# Patient Record
Sex: Female | Born: 1990 | Race: White | Hispanic: Refuse to answer | Marital: Married | Smoking: Never smoker
Health system: Southern US, Community
[De-identification: ages and names within clinical notes are randomized; demographics above are authoritative.]

## PROBLEM LIST (undated history)

## (undated) ENCOUNTER — Inpatient Hospital Stay (HOSPITAL_COMMUNITY): Payer: Self-pay

## (undated) DIAGNOSIS — Z789 Other specified health status: Secondary | ICD-10-CM

## (undated) DIAGNOSIS — O1493 Unspecified pre-eclampsia, third trimester: Secondary | ICD-10-CM

## (undated) HISTORY — PX: WISDOM TOOTH EXTRACTION: SHX21

## (undated) HISTORY — DX: Unspecified pre-eclampsia, third trimester: O14.93

---
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
## 2010-12-03 NOTE — Procedures (Signed)
VASCULAR LAB EXAM   INDICATION:  Bilateral leg lumps on the lateral aspect of the legs.   HISTORY:  Diabetes:  No.  Cardiac:  No.  Hypertension:  No.   EXAM:  Duplex of bilateral lower extremity below calf.   IMPRESSION:  Nonvascularized mass noted at the lateral aspect of  bilateral legs.  The right measured 0.41 cm x 0.71 cm and the second one  on the right measured 0.69 cm x 0.64 cm.  The left measured 0.69 x 0.64  cm.  The second one on the left measured 0.59 cm x 0.95 cm.   ___________________________________________  Larina Earthly, M.D.   MG/MEDQ  D:  11/05/2007  T:  11/05/2007  Job:  [id number]

## 2010-12-03 NOTE — Consult Note (Signed)
NEW PATIENT CONSULTATION   [name], [name]  DOB:  18-Oct-1990                                       11/05/2007  CHART#:[id number]   The patient presents today for evaluation of masses on her lower  extremities.  She is an otherwise healthy 20 year old female who is here  accompanied by her mother.  She had noticed these protrusions over her  bilateral lateral calves and pretibial areas.  She does not have any  pain associated with these.  There are no skin changes above them.  I am  seeing her today to determine if these are related to venous  varicosities.  She does not have any history of deep venous thrombosis.  Otherwise, she is a very healthy young lady who is a Holiday representative at [employer].  She runs on the cross-country team.  She is planning on  attending college in the fall.   PHYSICAL EXAM:  Well-developed well-nourished white female appears  stated age of 9.  Blood pressure 139/80, pulse 68, respirations 14.  Her lower extremities did not have any evidence of telangiectasia or  saphenous varicosities.  She does have several small masses over the  pretibial area bilaterally.  These are more pronounced on the left than  the right.   I imaged her saphenous veins and these appear to be normal.  On duplex  imaging of these masses, these are not vascular.  Specifically not  varicose veins.  They appear to be the same tissue density as fat.  I  discussed this with the patient and her mother present.  I explained  that this is not related to venous varicosities.  She does have 2+  dorsalis pedis as expected.  Explained I do not see any vascular  pathology.  I suspect that these may be a small lipoma.  I explained  that I am not concerned with any malignancy, since they are bilateral.  Her mother is still concerned regarding the presence of these.  I  explained that further evaluation, if she wishes to proceed, could  possibly be done with an MRI,  although I am not sure this is appropriate  and further consultation may be obtained from either general surgery or  orthopedic surgery.  They will see [place] again on an as-needed basis.   Larina Earthly, M.D.  Electronically Signed   TFE/MEDQ  D:  11/05/2007  T:  11/08/2007  Job:  [id number]   cc:   Evelena Peat, M.D.

## 2016-12-11 LAB — HM PAP SMEAR: HM Pap smear: NEGATIVE

## 2017-12-18 LAB — OB RESULTS CONSOLE ANTIBODY SCREEN: ANTIBODY SCREEN: NEGATIVE

## 2017-12-18 LAB — OB RESULTS CONSOLE RUBELLA ANTIBODY, IGM: Rubella: IMMUNE

## 2017-12-18 LAB — OB RESULTS CONSOLE GC/CHLAMYDIA
Chlamydia: NEGATIVE
Gonorrhea: NEGATIVE

## 2017-12-18 LAB — OB RESULTS CONSOLE RPR: RPR: NONREACTIVE

## 2017-12-18 LAB — OB RESULTS CONSOLE HIV ANTIBODY (ROUTINE TESTING): HIV: NONREACTIVE

## 2017-12-18 LAB — OB RESULTS CONSOLE HEPATITIS B SURFACE ANTIGEN: HEP B S AG: NEGATIVE

## 2017-12-22 LAB — OB RESULTS CONSOLE ABO/RH: RH Type: POSITIVE

## 2018-06-30 ENCOUNTER — Encounter (HOSPITAL_COMMUNITY): Payer: Self-pay | Admitting: *Deleted

## 2018-06-30 ENCOUNTER — Encounter (HOSPITAL_COMMUNITY): Payer: Self-pay | Admitting: Emergency Medicine

## 2018-06-30 ENCOUNTER — Other Ambulatory Visit: Payer: Self-pay

## 2018-06-30 ENCOUNTER — Telehealth (HOSPITAL_COMMUNITY): Payer: Self-pay | Admitting: *Deleted

## 2018-06-30 ENCOUNTER — Emergency Department (HOSPITAL_COMMUNITY)
Admission: EM | Admit: 2018-06-30 | Discharge: 2018-07-01 | Disposition: A | Discharge status: Home / Self Care | Payer: BLUE CROSS/BLUE SHIELD | Attending: Emergency Medicine | Admitting: Emergency Medicine

## 2018-06-30 DIAGNOSIS — S00501A Unspecified superficial injury of lip, initial encounter: Secondary | ICD-10-CM | POA: Insufficient documentation

## 2018-06-30 DIAGNOSIS — Y999 Unspecified external cause status: Secondary | ICD-10-CM | POA: Insufficient documentation

## 2018-06-30 DIAGNOSIS — Y929 Unspecified place or not applicable: Secondary | ICD-10-CM | POA: Diagnosis not present

## 2018-06-30 DIAGNOSIS — Y939 Activity, unspecified: Secondary | ICD-10-CM | POA: Insufficient documentation

## 2018-06-30 DIAGNOSIS — W5501XA Bitten by cat, initial encounter: Secondary | ICD-10-CM | POA: Insufficient documentation

## 2018-06-30 DIAGNOSIS — Z5321 Procedure and treatment not carried out due to patient leaving prior to being seen by health care provider: Secondary | ICD-10-CM | POA: Diagnosis not present

## 2018-06-30 NOTE — Telephone Encounter (Signed)
Preadmission screen  

## 2018-06-30 NOTE — ED Triage Notes (Addendum)
Patient states her cat bit her lower lip around 10 pm tonight. Patient states it was her cat and the cat has all vaccinations up to date.

## 2018-07-08 ENCOUNTER — Inpatient Hospital Stay (HOSPITAL_COMMUNITY)
Admission: AD | Admit: 2018-07-08 | Discharge: 2018-07-08 | Disposition: A | Discharge status: Home / Self Care | Payer: BLUE CROSS/BLUE SHIELD | Source: Ambulatory Visit | Attending: Obstetrics and Gynecology | Admitting: Obstetrics and Gynecology

## 2018-07-08 ENCOUNTER — Encounter (HOSPITAL_COMMUNITY): Payer: Self-pay | Admitting: *Deleted

## 2018-07-08 DIAGNOSIS — O163 Unspecified maternal hypertension, third trimester: Secondary | ICD-10-CM | POA: Diagnosis not present

## 2018-07-08 DIAGNOSIS — O133 Gestational [pregnancy-induced] hypertension without significant proteinuria, third trimester: Secondary | ICD-10-CM

## 2018-07-08 DIAGNOSIS — I10 Essential (primary) hypertension: Secondary | ICD-10-CM | POA: Diagnosis present

## 2018-07-08 DIAGNOSIS — Z3A36 36 weeks gestation of pregnancy: Secondary | ICD-10-CM | POA: Diagnosis not present

## 2018-07-08 HISTORY — DX: Other specified health status: Z78.9

## 2018-07-08 LAB — PROTEIN / CREATININE RATIO, URINE
Creatinine, Urine: 37 mg/dL
PROTEIN CREATININE RATIO: 0.27 mg/mg{creat} — AB (ref 0.00–0.15)
Total Protein, Urine: 10 mg/dL

## 2018-07-08 LAB — COMPREHENSIVE METABOLIC PANEL
ALK PHOS: 80 U/L (ref 38–126)
ALT: 27 U/L (ref 0–44)
ANION GAP: 7 (ref 5–15)
AST: 24 U/L (ref 15–41)
Albumin: 3 g/dL — ABNORMAL LOW (ref 3.5–5.0)
BUN: 13 mg/dL (ref 6–20)
CO2: 21 mmol/L — ABNORMAL LOW (ref 22–32)
Calcium: 8.8 mg/dL — ABNORMAL LOW (ref 8.9–10.3)
Chloride: 106 mmol/L (ref 98–111)
Creatinine, Ser: 0.54 mg/dL (ref 0.44–1.00)
GFR calc Af Amer: >60 mL/min (ref >60)
GFR calc non Af Amer: >60 mL/min (ref >60)
Glucose, Bld: 72 mg/dL (ref 70–99)
Potassium: 3.8 mmol/L (ref 3.5–5.1)
Sodium: 134 mmol/L — ABNORMAL LOW (ref 135–145)
Total Bilirubin: 0.4 mg/dL (ref 0.3–1.2)
Total Protein: 6.1 g/dL — ABNORMAL LOW (ref 6.5–8.1)

## 2018-07-08 LAB — CBC
HCT: 41.7 % (ref 36.0–46.0)
Hemoglobin: 14.4 g/dL (ref 12.0–15.0)
MCH: 32.7 pg (ref 26.0–34.0)
MCHC: 34.5 g/dL (ref 30.0–36.0)
MCV: 94.8 fL (ref 80.0–100.0)
Platelets: 273 10*3/uL (ref 150–400)
RBC: 4.4 MIL/uL (ref 3.87–5.11)
RDW: 12.7 % (ref 11.5–15.5)
WBC: 10.4 10*3/uL (ref 4.0–10.5)
nRBC: 0 % (ref 0.0–0.2)

## 2018-07-08 LAB — URINALYSIS, ROUTINE W REFLEX MICROSCOPIC
Bilirubin Urine: NEGATIVE
Glucose, UA: NEGATIVE mg/dL
Hgb urine dipstick: NEGATIVE
KETONES UR: NEGATIVE mg/dL
Leukocytes, UA: NEGATIVE
Nitrite: NEGATIVE
PROTEIN: NEGATIVE mg/dL
Specific Gravity, Urine: 1.009 (ref 1.005–1.030)
pH: 7 (ref 5.0–8.0)

## 2018-07-08 NOTE — Discharge Instructions (Signed)
Hypertension During Pregnancy ° °Hypertension, commonly called high blood pressure, is when the force of blood pumping through your arteries is too strong. Arteries are blood vessels that carry blood from the heart throughout the body. Hypertension during pregnancy can cause problems for you and your baby. Your baby may be born early (prematurely) or may not weigh as much as he or she should at birth. Very bad cases of hypertension during pregnancy can be life-threatening. °Different types of hypertension can occur during pregnancy. These include: °· Chronic hypertension. This happens when: °? You have hypertension before pregnancy and it continues during pregnancy. °? You develop hypertension before you are [redacted] weeks pregnant, and it continues during pregnancy. °· Gestational hypertension. This is hypertension that develops after the 20th week of pregnancy. °· Preeclampsia, also called toxemia of pregnancy. This is a very serious type of hypertension that develops during pregnancy. It can be very dangerous for you and your baby. °? In rare cases, you may develop preeclampsia after giving birth (postpartum preeclampsia). This usually occurs within 48 hours after childbirth but may occur up to 6 weeks after giving birth. °Gestational hypertension and preeclampsia usually go away within 6 weeks after your baby is born. Women who have hypertension during pregnancy have a greater chance of developing hypertension later in life or during future pregnancies. °What are the causes? °The exact cause of hypertension during pregnancy is not known. °What increases the risk? °There are certain factors that make it more likely for you to develop hypertension during pregnancy. These include: °· Having hypertension during a previous pregnancy or prior to pregnancy. °· Being overweight. °· Being age 35 or older. °· Being pregnant for the first time. °· Being pregnant with more than one baby. °· Becoming pregnant using fertilization  methods such as IVF (in vitro fertilization). °· Having diabetes, kidney problems, or systemic lupus erythematosus. °· Having a family history of hypertension. °What are the signs or symptoms? °Chronic hypertension and gestational hypertension rarely cause symptoms. Preeclampsia causes symptoms, which may include: °· Increased protein in your urine. Your health care provider will check for this at every visit before you give birth (prenatal visit). °· Severe headaches. °· Sudden weight gain. °· Swelling of the hands, face, legs, and feet. °· Nausea and vomiting. °· Vision problems, such as blurred or double vision. °· Numbness in the face, arms, legs, and feet. °· Dizziness. °· Slurred speech. °· Sensitivity to bright lights. °· Abdominal pain. °· Convulsions or seizures. °How is this diagnosed? °You may be diagnosed with hypertension during a routine prenatal exam. At each prenatal visit, you may: °· Have a urine test to check for high amounts of protein in your urine. °· Have your blood pressure checked. A blood pressure reading is given as two numbers, such as "120 over 80" (or 120/80). The first ("top") number is a measure of the pressure in your arteries when your heart beats (systolic pressure). The second ("bottom") number is a measure of the pressure in your arteries as your heart relaxes between beats (diastolic pressure). Blood pressure is measured in a unit called mm Hg. For most women, a normal blood pressure reading is: °? Systolic: below 120. °? Diastolic: below 80. °The type of hypertension that you are diagnosed with depends on your test results and when your symptoms developed. °· Chronic hypertension is usually diagnosed before 20 weeks of pregnancy. °· Gestational hypertension is usually diagnosed after 20 weeks of pregnancy. °· Hypertension with high amounts of protein in   the urine is diagnosed as preeclampsia. °· Blood pressure measurements that stay above 160 systolic, or above 110 diastolic,  are signs of severe preeclampsia. °How is this treated? °Treatment for hypertension during pregnancy varies depending on the type of hypertension you have and how serious it is. °· If you take medicines called ACE inhibitors to treat chronic hypertension, you may need to switch medicines. ACE inhibitors should not be taken during pregnancy. °· If you have gestational hypertension, you may need to take blood pressure medicine. °· If you are at risk for preeclampsia, your health care provider may recommend that you take a low-dose aspirin during your pregnancy. °· If you have severe preeclampsia, you may need to be hospitalized so you and your baby can be monitored closely. You may also need to take medicine (magnesium sulfate) to prevent seizures and to lower blood pressure. This medicine may be given as an injection or through an IV. °· In some cases, if your condition gets worse, you may need to deliver your baby early. °Follow these instructions at home: °Eating and drinking ° °· Drink enough fluid to keep your urine pale yellow. °· Avoid caffeine. °Lifestyle °· Do not use any products that contain nicotine or tobacco, such as cigarettes and e-cigarettes. If you need help quitting, ask your health care provider. °· Do not use alcohol or drugs. °· Avoid stress as much as possible. Rest and get plenty of sleep. °General instructions °· Take over-the-counter and prescription medicines only as told by your health care provider. °· While lying down, lie on your left side. This keeps pressure off your major blood vessels. °· While sitting or lying down, raise (elevate) your feet. Try putting some pillows under your lower legs. °· Exercise regularly. Ask your health care provider what kinds of exercise are best for you. °· Keep all prenatal and follow-up visits as told by your health care provider. This is important. °Contact a health care provider if: °· You have symptoms that your health care provider told you may  require more treatment or monitoring, such as: °? Nausea or vomiting. °? Headache. °Get help right away if you have: °· Severe abdominal pain that does not get better with treatment. °· A severe headache that does not get better. °· Vomiting that does not get better. °· Sudden, rapid weight gain. °· Sudden swelling in your hands, ankles, or face. °· Vaginal bleeding. °· Blood in your urine. °· Fewer movements from your baby than usual. °· Blurred or double vision. °· Muscle twitching or sudden muscle tightening (spasms). °· Shortness of breath. °· Blue fingernails or lips. °Summary °· Hypertension, commonly called high blood pressure, is when the force of blood pumping through your arteries is too strong. °· Hypertension during pregnancy can cause problems for you and your baby. °· Treatment for hypertension during pregnancy varies depending on the type of hypertension you have and how serious it is. °· Get help right away if you have symptoms that your health care provider told you to watch for. °This information is not intended to replace advice given to you by your health care provider. Make sure you discuss any questions you have with your health care provider. °Document Released: 03/25/2011 Document Revised: 06/23/2017 Document Reviewed: 12/21/2015 °Elsevier Interactive Patient Education © 2019 [employer] ° °

## 2018-07-08 NOTE — MAU Note (Signed)
Pt sent from MD office for elevated BP, has been up for 3 weeks.  Pt denies HA, visual changes, nausea or epigastric pain.  Does have swelling in feet & ankles.  Denies contractions, bleeding or LOF.  Reports good fetal movement.

## 2018-07-08 NOTE — MAU Provider Note (Signed)
History     CSN: [id number]  Arrival date and time: 07/08/18 1240   First Provider Initiated Contact with Patient 07/08/18 1321      Chief Complaint  Patient presents with  . Hypertension   HPI [name] [name] is a 27 y.o. G1P0 at [redacted]w[redacted]d who presents for evaluation of hypertension. She has been monitored for gestational hypertension for the last 3 weeks but today her BP was higher than before. She denies any headache, visual changes or epigastric pain. Denies pain. Denies vaginal bleeding or leaking of fluid. Reports normal fetal movement.   OB History    Gravida  1   Para      Term      Preterm      AB      Living        SAB      TAB      Ectopic      Multiple      Live Births              Past Medical History:  Diagnosis Date  . Medical history non-contributory     Past Surgical History:  Procedure Laterality Date  . WISDOM TOOTH EXTRACTION      Family History  Problem Relation Age of Onset  . Depression Sister   . Breast cancer Paternal Aunt   . Stroke Maternal Grandmother     Social History   Tobacco Use  . Smoking status: Never Smoker  . Smokeless tobacco: Never Used  Substance Use Topics  . Alcohol use: Never    Frequency: Never  . Drug use: Never    Allergies:  Allergies  Allergen Reactions  . Cefaclor Other (See Comments)    Childhood allergy  . Penicillins Other (See Comments)    Childhood allergy  . Sulfamethoxazole-Trimethoprim Other (See Comments)    Childhood allergy    No medications prior to admission.    Review of Systems  Constitutional: Negative.  Negative for fatigue and fever.  HENT: Negative.   Respiratory: Negative.  Negative for shortness of breath.   Cardiovascular: Negative.  Negative for chest pain.  Gastrointestinal: Negative.  Negative for abdominal pain, constipation, diarrhea, nausea and vomiting.  Genitourinary: Negative.  Negative for dysuria.  Neurological: Negative.  Negative for dizziness  and headaches.   Physical Exam   Blood pressure (!) 145/98, pulse 90, temperature 98.4 F (36.9 C), temperature source Oral, resp. rate 18, weight 79.4 kg, last menstrual period 10/23/2017, SpO2 98 %.  Patient Vitals for the past 24 hrs:  BP Temp Temp src Pulse Resp SpO2 Weight  07/08/18 1400 137/89 - - 86 - 97 % -  07/08/18 1345 (!) 144/88 - - 77 - 97 % -  07/08/18 1335 - - - - - 97 % -  07/08/18 1330 (!) 146/93 - - 78 - 97 % -  07/08/18 1325 - - - - - 97 % -  07/08/18 1315 (!) 145/98 - - 90 - 98 % -  07/08/18 1309 (!) 146/103 - - 87 - - -  07/08/18 1255 135/79 98.4 F (36.9 C) Oral 72 18 - 79.4 kg   Physical Exam  Nursing note and vitals reviewed. Constitutional: She is oriented to person, place, and time. She appears well-developed and well-nourished. No distress.  HENT:  Head: Normocephalic.  Eyes: Pupils are equal, round, and reactive to light.  Cardiovascular: Normal rate, regular rhythm and normal heart sounds.  Respiratory: Effort normal and breath sounds normal. No  respiratory distress.  GI: Soft. Bowel sounds are normal. She exhibits no distension. There is no abdominal tenderness.  Neurological: She is alert and oriented to person, place, and time. She has normal reflexes. She displays normal reflexes. She exhibits normal muscle tone. Coordination normal.  Skin: Skin is warm and dry.  Psychiatric: She has a normal mood and affect. Her behavior is normal. Judgment and thought content normal.   Fetal Tracing:  Baseline: 125 Variability: moderate Accels: 15x15 Decels: none  Toco: 3-6 min  MAU Course  Procedures Results for orders placed or performed during the hospital encounter of 07/08/18 (from the past 24 hour(s))  Protein / creatinine ratio, urine     Status: Abnormal   Collection Time: 07/08/18  1:06 PM  Result Value Ref Range   Creatinine, Urine 37.00 mg/dL   Total Protein, Urine 10 mg/dL   Protein Creatinine Ratio 0.27 (H) 0.00 - 0.15 mg/mg[Cre]   Urinalysis, Routine w reflex microscopic     Status: None   Collection Time: 07/08/18  1:06 PM  Result Value Ref Range   Color, Urine YELLOW YELLOW   APPearance CLEAR CLEAR   Specific Gravity, Urine 1.009 1.005 - 1.030   pH 7.0 5.0 - 8.0   Glucose, UA NEGATIVE NEGATIVE mg/dL   Hgb urine dipstick NEGATIVE NEGATIVE   Bilirubin Urine NEGATIVE NEGATIVE   Ketones, ur NEGATIVE NEGATIVE mg/dL   Protein, ur NEGATIVE NEGATIVE mg/dL   Nitrite NEGATIVE NEGATIVE   Leukocytes, UA NEGATIVE NEGATIVE  CBC     Status: None   Collection Time: 07/08/18  1:18 PM  Result Value Ref Range   WBC 10.4 4.0 - 10.5 K/uL   RBC 4.40 3.87 - 5.11 MIL/uL   Hemoglobin 14.4 12.0 - 15.0 g/dL   HCT 41.7 36.0 - 46.0 %   MCV 94.8 80.0 - 100.0 fL   MCH 32.7 26.0 - 34.0 pg   MCHC 34.5 30.0 - 36.0 g/dL   RDW 12.7 11.5 - 15.5 %   Platelets 273 150 - 400 K/uL   nRBC 0.0 0.0 - 0.2 %  Comprehensive metabolic panel     Status: Abnormal   Collection Time: 07/08/18  1:18 PM  Result Value Ref Range   Sodium 134 (L) 135 - 145 mmol/L   Potassium 3.8 3.5 - 5.1 mmol/L   Chloride 106 98 - 111 mmol/L   CO2 21 (L) 22 - 32 mmol/L   Glucose, Bld 72 70 - 99 mg/dL   BUN 13 6 - 20 mg/dL   Creatinine, Ser 0.54 0.44 - 1.00 mg/dL   Calcium 8.8 (L) 8.9 - 10.3 mg/dL   Total Protein 6.1 (L) 6.5 - 8.1 g/dL   Albumin 3.0 (L) 3.5 - 5.0 g/dL   AST 24 15 - 41 U/L   ALT 27 0 - 44 U/L   Alkaline Phosphatase 80 38 - 126 U/L   Total Bilirubin 0.4 0.3 - 1.2 mg/dL   GFR calc non Af Amer >60 >60 mL/min   GFR calc Af Amer >60 >60 mL/min   Anion gap 7 5 - 15   MDM UA CBC, CMP, Protein/creat ratio  Assessment and Plan   1. Gestational hypertension, third trimester   2. [redacted] weeks gestation of pregnancy    -Discharge home in stable condition -Strict preeclampsia precautions discussed -Patient advised to follow-up with [facility] tomorrow as scheduled for her induction of labor -Patient may return to MAU as needed or if her condition  were to change or worsen  Wende Mott CNM 07/08/2018, 1:21 PM

## 2018-07-10 ENCOUNTER — Inpatient Hospital Stay (HOSPITAL_COMMUNITY): Payer: BLUE CROSS/BLUE SHIELD | Admitting: Anesthesiology

## 2018-07-10 ENCOUNTER — Inpatient Hospital Stay (HOSPITAL_COMMUNITY)
Admission: RE | Admit: 2018-07-10 | Discharge: 2018-07-13 | DRG: 788 | Disposition: A | Discharge status: Home / Self Care | Payer: BLUE CROSS/BLUE SHIELD | Attending: Obstetrics & Gynecology | Admitting: Obstetrics & Gynecology

## 2018-07-10 ENCOUNTER — Encounter (HOSPITAL_COMMUNITY)
Admission: RE | Disposition: A | Discharge status: Home / Self Care | Payer: Self-pay | Source: Home / Self Care | Attending: Obstetrics & Gynecology

## 2018-07-10 ENCOUNTER — Encounter (HOSPITAL_COMMUNITY): Payer: Self-pay

## 2018-07-10 ENCOUNTER — Other Ambulatory Visit: Payer: Self-pay

## 2018-07-10 DIAGNOSIS — O1490 Unspecified pre-eclampsia, unspecified trimester: Secondary | ICD-10-CM | POA: Diagnosis present

## 2018-07-10 DIAGNOSIS — Z98891 History of uterine scar from previous surgery: Secondary | ICD-10-CM

## 2018-07-10 DIAGNOSIS — O1404 Mild to moderate pre-eclampsia, complicating childbirth: Secondary | ICD-10-CM | POA: Diagnosis present

## 2018-07-10 DIAGNOSIS — Z3A37 37 weeks gestation of pregnancy: Secondary | ICD-10-CM | POA: Diagnosis not present

## 2018-07-10 LAB — TYPE AND SCREEN
ABO/RH(D): O POS
Antibody Screen: NEGATIVE

## 2018-07-10 LAB — COMPREHENSIVE METABOLIC PANEL
ALBUMIN: 2.9 g/dL — AB (ref 3.5–5.0)
ALT: 29 U/L (ref 0–44)
AST: 26 U/L (ref 15–41)
Alkaline Phosphatase: 76 U/L (ref 38–126)
Anion gap: 8 (ref 5–15)
BUN: 15 mg/dL (ref 6–20)
CALCIUM: 9.1 mg/dL (ref 8.9–10.3)
CO2: 21 mmol/L — ABNORMAL LOW (ref 22–32)
Chloride: 104 mmol/L (ref 98–111)
Creatinine, Ser: 0.51 mg/dL (ref 0.44–1.00)
GFR calc Af Amer: >60 mL/min (ref >60)
GFR calc non Af Amer: >60 mL/min (ref >60)
Glucose, Bld: 77 mg/dL (ref 70–99)
POTASSIUM: 3.8 mmol/L (ref 3.5–5.1)
Sodium: 133 mmol/L — ABNORMAL LOW (ref 135–145)
TOTAL PROTEIN: 5.8 g/dL — AB (ref 6.5–8.1)
Total Bilirubin: 0.3 mg/dL (ref 0.3–1.2)

## 2018-07-10 LAB — CBC
HCT: 40.3 % (ref 36.0–46.0)
Hemoglobin: 14.2 g/dL (ref 12.0–15.0)
MCH: 33.2 pg (ref 26.0–34.0)
MCHC: 35.2 g/dL (ref 30.0–36.0)
MCV: 94.2 fL (ref 80.0–100.0)
Platelets: 286 10*3/uL (ref 150–400)
RBC: 4.28 MIL/uL (ref 3.87–5.11)
RDW: 12.9 % (ref 11.5–15.5)
WBC: 12.6 10*3/uL — ABNORMAL HIGH (ref 4.0–10.5)
nRBC: 0 % (ref 0.0–0.2)

## 2018-07-10 LAB — ABO/RH: ABO/RH(D): O POS

## 2018-07-10 LAB — RPR: RPR Ser Ql: NONREACTIVE

## 2018-07-10 SURGERY — Surgical Case
Anesthesia: Spinal | Site: Abdomen | Wound class: Clean Contaminated

## 2018-07-10 MED ORDER — PROMETHAZINE HCL 25 MG/ML IJ SOLN: 6.2500 mg | INTRAMUSCULAR | Status: DC | PRN

## 2018-07-10 MED ORDER — GENTAMICIN SULFATE 40 MG/ML IJ SOLN
5.0000 mg/kg | Freq: Once | INTRAVENOUS | Status: AC
  Administered 2018-07-10: 300 mg via INTRAVENOUS
  Filled 2018-07-10: qty 9.75

## 2018-07-10 MED ORDER — KETOROLAC TROMETHAMINE 30 MG/ML IJ SOLN
INTRAMUSCULAR | Status: AC
  Administered 2018-07-10: 30 mg via INTRAVENOUS
  Filled 2018-07-10: qty 1

## 2018-07-10 MED ORDER — MORPHINE SULFATE (PF) 4 MG/ML IV SOLN: 1.0000 mg | INTRAVENOUS | Status: DC | PRN

## 2018-07-10 MED ORDER — PHENYLEPHRINE 40 MCG/ML (10ML) SYRINGE FOR IV PUSH (FOR BLOOD PRESSURE SUPPORT)
PREFILLED_SYRINGE | INTRAVENOUS | Status: AC
  Filled 2018-07-10: qty 10

## 2018-07-10 MED ORDER — PHENYLEPHRINE 8 MG IN D5W 100 ML (0.08MG/ML) PREMIX OPTIME
INJECTION | INTRAVENOUS | Status: AC
  Filled 2018-07-10: qty 100

## 2018-07-10 MED ORDER — MIDAZOLAM HCL 2 MG/2ML IJ SOLN: 0.5000 mg | Freq: Once | INTRAMUSCULAR | Status: DC | PRN

## 2018-07-10 MED ORDER — MORPHINE SULFATE (PF) 0.5 MG/ML IJ SOLN
INTRAMUSCULAR | Status: DC | PRN
  Administered 2018-07-10: .15 mg via INTRATHECAL

## 2018-07-10 MED ORDER — LACTATED RINGERS IV SOLN
INTRAVENOUS | Status: DC
  Administered 2018-07-10 (×4): via INTRAVENOUS

## 2018-07-10 MED ORDER — SCOPOLAMINE 1 MG/3DAYS TD PT72
MEDICATED_PATCH | TRANSDERMAL | Status: AC
  Filled 2018-07-10: qty 1

## 2018-07-10 MED ORDER — KETOROLAC TROMETHAMINE 30 MG/ML IJ SOLN
30.0000 mg | Freq: Once | INTRAMUSCULAR | Status: AC | PRN
  Administered 2018-07-10: 30 mg via INTRAVENOUS

## 2018-07-10 MED ORDER — FLEET ENEMA 7-19 GM/118ML RE ENEM: 1.0000 | ENEMA | RECTAL | Status: DC | PRN

## 2018-07-10 MED ORDER — ZOLPIDEM TARTRATE 5 MG PO TABS: 5.0000 mg | ORAL_TABLET | Freq: Every evening | ORAL | Status: DC | PRN

## 2018-07-10 MED ORDER — OXYCODONE-ACETAMINOPHEN 5-325 MG PO TABS: 2.0000 | ORAL_TABLET | ORAL | Status: DC | PRN

## 2018-07-10 MED ORDER — OXYTOCIN BOLUS FROM INFUSION: 500.0000 mL | Freq: Once | INTRAVENOUS | Status: DC

## 2018-07-10 MED ORDER — PHENYLEPHRINE HCL 10 MG/ML IJ SOLN
INTRAMUSCULAR | Status: DC | PRN
  Administered 2018-07-10: 80 ug via INTRAVENOUS

## 2018-07-10 MED ORDER — GENTAMICIN SULFATE 40 MG/ML IJ SOLN: 2.0000 mg/kg | Freq: Once | INTRAVENOUS | Status: DC

## 2018-07-10 MED ORDER — OXYTOCIN 10 UNIT/ML IJ SOLN
INTRAVENOUS | Status: DC | PRN
  Administered 2018-07-10: 40 [IU] via INTRAVENOUS

## 2018-07-10 MED ORDER — LACTATED RINGERS IV SOLN: 500.0000 mL | INTRAVENOUS | Status: DC | PRN

## 2018-07-10 MED ORDER — BUPIVACAINE IN DEXTROSE 0.75-8.25 % IT SOLN
INTRATHECAL | Status: DC | PRN
  Administered 2018-07-10: 1.6 mL via INTRATHECAL

## 2018-07-10 MED ORDER — DEXAMETHASONE SODIUM PHOSPHATE 4 MG/ML IJ SOLN
INTRAMUSCULAR | Status: DC | PRN
  Administered 2018-07-10: 4 mg via INTRAVENOUS

## 2018-07-10 MED ORDER — OXYTOCIN 40 UNITS IN LACTATED RINGERS INFUSION - SIMPLE MED
1.0000 m[IU]/min | INTRAVENOUS | Status: DC
  Administered 2018-07-10: 2 m[IU]/min via INTRAVENOUS

## 2018-07-10 MED ORDER — TERBUTALINE SULFATE 1 MG/ML IJ SOLN
0.2500 mg | Freq: Once | INTRAMUSCULAR | Status: DC | PRN
  Filled 2018-07-10: qty 1

## 2018-07-10 MED ORDER — OXYCODONE-ACETAMINOPHEN 5-325 MG PO TABS: 1.0000 | ORAL_TABLET | ORAL | Status: DC | PRN

## 2018-07-10 MED ORDER — OXYTOCIN 10 UNIT/ML IJ SOLN
INTRAMUSCULAR | Status: AC
  Filled 2018-07-10: qty 4

## 2018-07-10 MED ORDER — MEPERIDINE HCL 25 MG/ML IJ SOLN: 6.2500 mg | INTRAMUSCULAR | Status: DC | PRN

## 2018-07-10 MED ORDER — CLINDAMYCIN PHOSPHATE 900 MG/50ML IV SOLN: 900.0000 mg | Freq: Once | INTRAVENOUS | Status: DC

## 2018-07-10 MED ORDER — PHENYLEPHRINE 8 MG IN D5W 100 ML (0.08MG/ML) PREMIX OPTIME
INJECTION | INTRAVENOUS | Status: DC | PRN
  Administered 2018-07-10: 20 ug/min via INTRAVENOUS

## 2018-07-10 MED ORDER — ONDANSETRON HCL 4 MG/2ML IJ SOLN: 4.0000 mg | Freq: Four times a day (QID) | INTRAMUSCULAR | Status: DC | PRN

## 2018-07-10 MED ORDER — TERBUTALINE SULFATE 1 MG/ML IJ SOLN: 0.2500 mg | Freq: Once | INTRAMUSCULAR | Status: DC | PRN

## 2018-07-10 MED ORDER — ONDANSETRON HCL 4 MG/2ML IJ SOLN
INTRAMUSCULAR | Status: AC
  Filled 2018-07-10: qty 2

## 2018-07-10 MED ORDER — ONDANSETRON HCL 4 MG/2ML IJ SOLN
INTRAMUSCULAR | Status: DC | PRN
  Administered 2018-07-10: 4 mg via INTRAVENOUS

## 2018-07-10 MED ORDER — ACETAMINOPHEN 325 MG PO TABS: 650.0000 mg | ORAL_TABLET | ORAL | Status: DC | PRN

## 2018-07-10 MED ORDER — MORPHINE SULFATE (PF) 0.5 MG/ML IJ SOLN
INTRAMUSCULAR | Status: AC
  Filled 2018-07-10: qty 10

## 2018-07-10 MED ORDER — OXYTOCIN 40 UNITS IN LACTATED RINGERS INFUSION - SIMPLE MED
2.5000 [IU]/h | INTRAVENOUS | Status: DC
  Filled 2018-07-10: qty 1000

## 2018-07-10 MED ORDER — FENTANYL CITRATE (PF) 100 MCG/2ML IJ SOLN
INTRAMUSCULAR | Status: DC | PRN
  Administered 2018-07-10: 15 ug via INTRATHECAL

## 2018-07-10 MED ORDER — LIDOCAINE HCL (PF) 1 % IJ SOLN: 30.0000 mL | INTRAMUSCULAR | Status: DC | PRN

## 2018-07-10 MED ORDER — FENTANYL CITRATE (PF) 100 MCG/2ML IJ SOLN
50.0000 ug | INTRAMUSCULAR | Status: DC | PRN
  Administered 2018-07-10 (×2): 100 ug via INTRAVENOUS
  Filled 2018-07-10 (×2): qty 2

## 2018-07-10 MED ORDER — DEXAMETHASONE SODIUM PHOSPHATE 4 MG/ML IJ SOLN
INTRAMUSCULAR | Status: AC
  Filled 2018-07-10: qty 1

## 2018-07-10 MED ORDER — MISOPROSTOL 25 MCG QUARTER TABLET
25.0000 ug | ORAL_TABLET | ORAL | Status: DC | PRN
  Administered 2018-07-10 (×2): 25 ug via VAGINAL
  Filled 2018-07-10 (×2): qty 1

## 2018-07-10 MED ORDER — FENTANYL CITRATE (PF) 100 MCG/2ML IJ SOLN
INTRAMUSCULAR | Status: AC
  Filled 2018-07-10: qty 2

## 2018-07-10 MED ORDER — LACTATED RINGERS IV SOLN
INTRAVENOUS | Status: DC | PRN
  Administered 2018-07-10 (×2): via INTRAVENOUS

## 2018-07-10 MED ORDER — LACTATED RINGERS IV SOLN
INTRAVENOUS | Status: DC | PRN
  Administered 2018-07-10: 20:00:00 via INTRAVENOUS

## 2018-07-10 MED ORDER — SOD CITRATE-CITRIC ACID 500-334 MG/5ML PO SOLN
30.0000 mL | ORAL | Status: DC | PRN
  Administered 2018-07-10: 30 mL via ORAL
  Filled 2018-07-10: qty 15

## 2018-07-10 SURGICAL SUPPLY — 37 items
BENZOIN TINCTURE PRP APPL 2/3 (GAUZE/BANDAGES/DRESSINGS) ×3 IMPLANT
CHLORAPREP W/TINT 26ML (MISCELLANEOUS) ×3 IMPLANT
CLAMP CORD UMBIL (MISCELLANEOUS) IMPLANT
CLOSURE WOUND 1/2 X4 (GAUZE/BANDAGES/DRESSINGS) ×1
CLOTH BEACON ORANGE TIMEOUT ST (SAFETY) ×3 IMPLANT
DERMABOND ADVANCED (GAUZE/BANDAGES/DRESSINGS)
DERMABOND ADVANCED .7 DNX12 (GAUZE/BANDAGES/DRESSINGS) IMPLANT
DRSG OPSITE POSTOP 4X10 (GAUZE/BANDAGES/DRESSINGS) ×3 IMPLANT
ELECT REM PT RETURN 9FT ADLT (ELECTROSURGICAL) ×3
ELECTRODE REM PT RTRN 9FT ADLT (ELECTROSURGICAL) ×1 IMPLANT
EXTRACTOR VACUUM KIWI (MISCELLANEOUS) IMPLANT
GLOVE BIO SURGEON STRL SZ 6 (GLOVE) ×3 IMPLANT
GLOVE BIOGEL PI IND STRL 6 (GLOVE) ×2 IMPLANT
GLOVE BIOGEL PI IND STRL 7.0 (GLOVE) ×1 IMPLANT
GLOVE BIOGEL PI INDICATOR 6 (GLOVE) ×4
GLOVE BIOGEL PI INDICATOR 7.0 (GLOVE) ×2
GOWN STRL REUS W/TWL LRG LVL3 (GOWN DISPOSABLE) ×6 IMPLANT
KIT ABG SYR 3ML LUER SLIP (SYRINGE) ×3 IMPLANT
NEEDLE HYPO 25X5/8 SAFETYGLIDE (NEEDLE) ×3 IMPLANT
NS IRRIG 1000ML POUR BTL (IV SOLUTION) ×3 IMPLANT
PACK C SECTION WH (CUSTOM PROCEDURE TRAY) ×3 IMPLANT
PAD ABD 8X7 1/2 STERILE (GAUZE/BANDAGES/DRESSINGS) ×3 IMPLANT
PAD OB MATERNITY 4.3X12.25 (PERSONAL CARE ITEMS) ×3 IMPLANT
PENCIL SMOKE EVAC W/HOLSTER (ELECTROSURGICAL) ×3 IMPLANT
SPONGE GAUZE 4X4 12PLY (GAUZE/BANDAGES/DRESSINGS) ×3 IMPLANT
SPONGE LAP 18X18 RF (DISPOSABLE) ×9 IMPLANT
STRIP CLOSURE SKIN 1/2X4 (GAUZE/BANDAGES/DRESSINGS) ×2 IMPLANT
SUT CHROMIC 0 CTX 36 (SUTURE) ×9 IMPLANT
SUT MON AB 2-0 CT1 27 (SUTURE) ×3 IMPLANT
SUT PDS AB 0 CT1 27 (SUTURE) IMPLANT
SUT PLAIN 0 NONE (SUTURE) IMPLANT
SUT VIC AB 0 CT1 27 (SUTURE) ×2
SUT VIC AB 0 CT1 27XBRD ANBCTR (SUTURE) ×1 IMPLANT
SUT VIC AB 0 CT1 36 (SUTURE) IMPLANT
SUT VIC AB 4-0 KS 27 (SUTURE) IMPLANT
TOWEL OR 17X24 6PK STRL BLUE (TOWEL DISPOSABLE) ×6 IMPLANT
TRAY FOLEY W/BAG SLVR 14FR LF (SET/KITS/TRAYS/PACK) IMPLANT

## 2018-07-10 NOTE — Progress Notes (Signed)
Despite multiple resuscitative efforts, persistent repetitive late decelerations and now decreased variability.  CVX unchanged at 1 cm.  Recommend C/S for fetal intolerance of labor.  Patient is counseled re: risk of bleeding, infection, scarring, and damage to surrounding structures.  Patient understands the 1% risk of uterine rupture in subsequent pregnancies.  All questions were answered and the patient wishes to proceed.  Linda Hedges, DO

## 2018-07-10 NOTE — Anesthesia Procedure Notes (Signed)
Spinal  Patient location during procedure: OR End time: 07/10/2018 7:14 PM Staffing Anesthesiologist: Annye Asa, MD Performed: anesthesiologist  Preanesthetic Checklist Completed: patient identified, surgical consent, pre-op evaluation, timeout performed, IV checked, risks and benefits discussed and monitors and equipment checked Spinal Block Patient position: sitting Prep: site prepped and draped and DuraPrep Patient monitoring: blood pressure, continuous pulse ox, cardiac monitor and heart rate Approach: midline Location: L3-4 Injection technique: single-shot Needle Needle type: Pencan  Needle gauge: 24 G Needle length: 9 cm Assessment Sensory level: T5. Additional Notes Pt identified in Operating room.  Monitors applied. Working IV access confirmed. Sterile prep, drape lumbar spine.  1% lido local L 3,4.  #24ga Pencan into clear CSF L 3,4.  12mg  0.75% Bupivacaine with dextrose, fentanyl, morphine injected with asp CSF beginning and end of injection.  Patient asymptomatic, VSS, no heme aspirated, tolerated well.  Jenita Seashore, MD

## 2018-07-10 NOTE — Op Note (Signed)
[facility] PROCEDURE DATE: 07/10/2018  PREOPERATIVE DIAGNOSIS: Intrauterine pregnancy at  [redacted]w[redacted]d weeks gestation, pre-eclampsia without severe features, fetal intolerance of labor  POSTOPERATIVE DIAGNOSIS: The same  PROCEDURE:   Primary Low Transverse Cesarean Section  SURGEON:  Dr. Linda Hedges  INDICATIONS: [name] [name] is a 27 y.o. G1P0 at [redacted]w[redacted]d scheduled for cesarean section secondary to fetal intolerance of labor.  The risks of cesarean section discussed with the patient included but were not limited to: bleeding which may require transfusion or reoperation; infection which may require antibiotics; injury to bowel, bladder, ureters or other surrounding organs; injury to the fetus; need for additional procedures including hysterectomy in the event of a life-threatening hemorrhage; placental abnormalities wth subsequent pregnancies, incisional problems, thromboembolic phenomenon and other postoperative/anesthesia complications. The patient concurred with the proposed plan, giving informed written consent for the procedure.    FINDINGS:  Viable female infant in cephalic presentation, APGARs pending: weight pending  Clear amniotic fluid.  Intact placenta, three vessel cord.  Grossly normal uterus, ovaries and fallopian tubes. .   ANESTHESIA: Spinal ESTIMATED BLOOD LOSS: 322 mL ml SPECIMENS: Placenta sent to L&D COMPLICATIONS: None immediate  PROCEDURE IN DETAIL:  The patient received intravenous antibiotics and had sequential compression devices applied to her lower extremities while in the preoperative area.  She was then taken to the operating room where spinal anesthesia was administered and was found to be adequate. She was then placed in a dorsal supine position with a leftward tilt, and prepped and draped in a sterile manner.  A foley catheter was placed into her bladder and attached to constant gravity.  After an adequate timeout was performed, a Pfannenstiel skin incision was made  with scalpel and carried through to the underlying layer of fascia. The fascia was incised in the midline and this incision was extended bilaterally using the Mayo scissors. Kocher clamps were applied to the superior aspect of the fascial incision and the underlying rectus muscles were dissected off bluntly. A similar process was carried out on the inferior aspect of the facial incision. The rectus muscles were separated in the midline bluntly and the peritoneum was entered bluntly.  Bladder flap was created sharply and developed bluntly.  Bladder blade was placed.  A transverse hysterotomy was made with a scalpel and extended bilaterally bluntly. The bladder blade was then removed. The infant was successfully delivered, and cord was clamped and cut and infant was handed over to awaiting neonatology team. Uterine massage was then administered and the placenta delivered intact with three-vessel cord. The uterus was cleared of clot and debris.  The hysterotomy was closed with 0 chromic.  A second imbricating suture of 0-chromic was used to reinforce the incision and aid in hemostasis.  The peritoneum and rectus muscles were noted to be hemostatic and were reapproximated using 3-0 monocryl in a running fashion.  The fascia was closed with 0-Vicryl in a running fashion with good restoration of anatomy.  The subcutaneus tissue was copiously irrigated.  The skin was closed with 4-0 vicryl in a subcuticular fashion.  Pt tolerated the procedure will.  All counts were correct x2.  Pt went to the recovery room in stable condition.

## 2018-07-10 NOTE — Transfer of Care (Signed)
Immediate Anesthesia Transfer of Care Note  Patient: [name] [name]  Procedure(s) Performed: CESAREAN SECTION (N/A Abdomen)  Patient Location: PACU  Anesthesia Type:Spinal  Level of Consciousness: awake, alert  and oriented  Airway & Oxygen Therapy: Patient Spontanous Breathing  Post-op Assessment: Report given to RN and Post -op Vital signs reviewed and stable  Post vital signs: Reviewed and stable  Last Vitals:  Vitals Value Taken Time  BP 139/83 07/10/2018  8:20 PM  Temp    Pulse 82 07/10/2018  8:22 PM  Resp 19 07/10/2018  8:22 PM  SpO2 98 % 07/10/2018  8:22 PM  Vitals shown include unvalidated device data.  Last Pain:  Vitals:   07/10/18 1802  TempSrc:   PainSc: 3          Complications: No apparent anesthesia complications

## 2018-07-10 NOTE — Progress Notes (Signed)
Repetitive late decelerations and loss of variability.  Patient repositioned and SROM with clear fluid while up to bathroom.  IUPC placed with scalp stim noted.  CVX 1/50/-3.  Will closely monitor.  If fetus unable to tolerate adequate MVUs, will recommend C/S for fetal intolerance.

## 2018-07-10 NOTE — Anesthesia Preprocedure Evaluation (Signed)
Anesthesia Evaluation  Patient identified by MRN, date of birth, ID band Patient awake    Reviewed: Allergy & Precautions, NPO status , Patient's Chart, lab work & pertinent test results  History of Anesthesia Complications Negative for: history of anesthetic complications  Airway Mallampati: IV  TM Distance: >3 FB Neck ROM: Full    Dental  (+) Dental Advisory Given   Pulmonary neg pulmonary ROS,    breath sounds clear to auscultation       Cardiovascular hypertension (PIH),  Rhythm:Regular Rate:Normal     Neuro/Psych negative neurological ROS     GI/Hepatic Neg liver ROS, GERD  Poorly Controlled,  Endo/Other  negative endocrine ROS  Renal/GU negative Renal ROS     Musculoskeletal   Abdominal   Peds  Hematology plt 238   Anesthesia Other Findings   Reproductive/Obstetrics (+) Pregnancy                             Anesthesia Physical Anesthesia Plan  ASA: II and emergent  Anesthesia Plan: Spinal   Post-op Pain Management:    Induction:   PONV Risk Score and Plan: 2 and Ondansetron, Dexamethasone and Scopolamine patch - Pre-op  Airway Management Planned: Natural Airway  Additional Equipment:   Intra-op Plan:   Post-operative Plan:   Informed Consent: I have reviewed the patients History and Physical, chart, labs and discussed the procedure including the risks, benefits and alternatives for the proposed anesthesia with the patient or authorized representative who has indicated his/her understanding and acceptance.   Dental advisory given  Plan Discussed with: CRNA and Surgeon  Anesthesia Plan Comments:         Anesthesia Quick Evaluation

## 2018-07-10 NOTE — Anesthesia Postprocedure Evaluation (Signed)
Anesthesia Post Note  Patient: Regis Bill  Procedure(s) Performed: CESAREAN SECTION (N/A Abdomen)     Patient location during evaluation: PACU Anesthesia Type: Spinal Level of consciousness: awake and alert, patient cooperative and oriented Pain management: pain level controlled Vital Signs Assessment: post-procedure vital signs reviewed and stable Respiratory status: spontaneous breathing, nonlabored ventilation and respiratory function stable Cardiovascular status: blood pressure returned to baseline and stable Postop Assessment: spinal receding, patient able to bend at knees and no apparent nausea or vomiting Anesthetic complications: no    Last Vitals:  Vitals:   07/10/18 2115 07/10/18 2120  BP: 129/87   Pulse: 77 76  Resp: 17 19  Temp: 36.7 C   SpO2: 97% 96%    Last Pain:  Vitals:   07/10/18 2120  TempSrc:   PainSc: 1    Pain Goal:                 Lawyer Washabaugh,E[employer]

## 2018-07-10 NOTE — Anesthesia Pain Management Evaluation Note (Signed)
  CRNA Pain Management Visit Note  Patient: [name] [name], 27 y.o., female  "Hello I am a member of the anesthesia team at [facility]. We have an anesthesia team available at all times to provide care throughout the hospital, including epidural management and anesthesia for C-section. I don't know your plan for the delivery whether it a natural birth, water birth, IV sedation, nitrous supplementation, doula or epidural, but we want to meet your pain goals."   1.Was your pain managed to your expectations on prior hospitalizations?   No prior hospitalizations  2.What is your expectation for pain management during this hospitalization?     Epidural  3.How can we help you reach that goal? unsure  Record the patient's initial score and the patient's pain goal.   Pain: 3  Pain Goal: 5 [facility] wants you to be able to say your pain was always managed very well.  Casimer Lanius 07/10/2018

## 2018-07-10 NOTE — H&P (Signed)
[name] [name] is a 27 y.o. female presenting for induction of labor for pre-eclampsia without severe features.  No HA, CP/SOB, RUQ pain, or visual disturbance.  Active FM.  GBS negative.  Patient received 2 doses of vmp overnight and is currently contracting too frequently for another dose.    OB History    Gravida  1   Para      Term      Preterm      AB      Living        SAB      TAB      Ectopic      Multiple      Live Births             Past Medical History:  Diagnosis Date  . Medical history non-contributory    Past Surgical History:  Procedure Laterality Date  . WISDOM TOOTH EXTRACTION     Family History: family history includes Breast cancer in her paternal aunt; Depression in her sister; Stroke in her maternal grandmother. Social History:  reports that she has never smoked. She has never used smokeless tobacco. She reports that she does not drink alcohol or use drugs.     Maternal Diabetes: No Genetic Screening: Normal Maternal Ultrasounds/Referrals: Normal Fetal Ultrasounds or other Referrals:  None Maternal Substance Abuse:  No Significant Maternal Medications:  None Significant Maternal Lab Results:  Lab values include: Group B Strep negative Other Comments:  None  ROS Maternal Medical History:  Fetal activity: Perceived fetal activity is normal.   Last perceived fetal movement was within the past hour.    Prenatal complications: Pre-eclampsia.   Prenatal Complications - Diabetes: none.    Dilation: 1 Effacement (%): 50 Station: -3 Exam by:: Dr Lynnette Caffey Blood pressure 126/76, pulse 80, temperature 98.6 F (37 C), temperature source Oral, resp. rate 18, height 5\' 5"  (1.651 m), weight 78.6 kg, last menstrual period 10/23/2017. Maternal Exam:  Uterine Assessment: Contraction strength is mild.  Contraction frequency is irregular.   Abdomen: Patient reports no abdominal tenderness. Fundal height is c/w dates.   Estimated fetal weight is 7#.    Fetal presentation: vertex  Introitus: Normal vulva. Pelvis: adequate for delivery.   Cervix: Cervix evaluated by digital exam.     Physical Exam  Constitutional: She is oriented to person, place, and time. She appears well-developed and well-nourished.  GI: Soft. There is no rebound and no guarding.  Genitourinary:    Vulva normal.   Neurological: She is alert and oriented to person, place, and time.  Skin: Skin is warm and dry.  Psychiatric: She has a normal mood and affect. Her behavior is normal.    Prenatal labs: ABO, Rh: --/--/O POS, O POS Performed at [facility], [address] [place], [place] [address]  [phone]) Antibody: NEG (12/21 0125) Rubella: Immune (05/31 0000) RPR: Nonreactive (05/31 0000)  HBsAg: Negative (05/31 0000)  HIV: Non-reactive (05/31 0000)  GBS:     Assessment/Plan: 27yo G1 at 37 weeks with pre-eclampsia without severe features -Foley bulb placed and will start pitocin -IV narcotics or CLEA as desired -Anticipate NSVD -IV antihypertensives/magnesium prn   Linda Hedges 07/10/2018, 10:20 AM

## 2018-07-11 ENCOUNTER — Encounter (HOSPITAL_COMMUNITY): Payer: Self-pay | Admitting: Obstetrics & Gynecology

## 2018-07-11 DIAGNOSIS — Z98891 History of uterine scar from previous surgery: Secondary | ICD-10-CM

## 2018-07-11 LAB — CBC
HCT: 33.9 % — ABNORMAL LOW (ref 36.0–46.0)
Hemoglobin: 11.5 g/dL — ABNORMAL LOW (ref 12.0–15.0)
MCH: 32.7 pg (ref 26.0–34.0)
MCHC: 33.9 g/dL (ref 30.0–36.0)
MCV: 96.3 fL (ref 80.0–100.0)
PLATELETS: 204 10*3/uL (ref 150–400)
RBC: 3.52 MIL/uL — ABNORMAL LOW (ref 3.87–5.11)
RDW: 12.8 % (ref 11.5–15.5)
WBC: 19.6 10*3/uL — AB (ref 4.0–10.5)
nRBC: 0 % (ref 0.0–0.2)

## 2018-07-11 MED ORDER — SODIUM CHLORIDE 0.9% FLUSH: 3.0000 mL | INTRAVENOUS | Status: DC | PRN

## 2018-07-11 MED ORDER — DIBUCAINE 1 % RE OINT: 1.0000 "application " | TOPICAL_OINTMENT | RECTAL | Status: DC | PRN

## 2018-07-11 MED ORDER — NALOXONE HCL 4 MG/10ML IJ SOLN: 1.0000 ug/kg/h | INTRAVENOUS | Status: DC | PRN

## 2018-07-11 MED ORDER — DIPHENHYDRAMINE HCL 25 MG PO CAPS: 25.0000 mg | ORAL_CAPSULE | ORAL | Status: DC | PRN

## 2018-07-11 MED ORDER — NALBUPHINE HCL 10 MG/ML IJ SOLN: 5.0000 mg | Freq: Once | INTRAMUSCULAR | Status: DC | PRN

## 2018-07-11 MED ORDER — NALBUPHINE HCL 10 MG/ML IJ SOLN: 5.0000 mg | INTRAMUSCULAR | Status: DC | PRN

## 2018-07-11 MED ORDER — IBUPROFEN 800 MG PO TABS
800.0000 mg | ORAL_TABLET | Freq: Four times a day (QID) | ORAL | Status: DC | PRN
  Administered 2018-07-11 – 2018-07-13 (×7): 800 mg via ORAL
  Filled 2018-07-11 (×8): qty 1

## 2018-07-11 MED ORDER — SIMETHICONE 80 MG PO CHEW
80.0000 mg | CHEWABLE_TABLET | ORAL | Status: DC
  Administered 2018-07-11 – 2018-07-12 (×2): 80 mg via ORAL
  Filled 2018-07-11 (×2): qty 1

## 2018-07-11 MED ORDER — SIMETHICONE 80 MG PO CHEW: 80.0000 mg | CHEWABLE_TABLET | ORAL | Status: DC | PRN

## 2018-07-11 MED ORDER — DIPHENHYDRAMINE HCL 50 MG/ML IJ SOLN: 12.5000 mg | INTRAMUSCULAR | Status: DC | PRN

## 2018-07-11 MED ORDER — MENTHOL 3 MG MT LOZG: 1.0000 | LOZENGE | OROMUCOSAL | Status: DC | PRN

## 2018-07-11 MED ORDER — SCOPOLAMINE 1 MG/3DAYS TD PT72: 1.0000 | MEDICATED_PATCH | Freq: Once | TRANSDERMAL | Status: DC

## 2018-07-11 MED ORDER — DIPHENHYDRAMINE HCL 25 MG PO CAPS: 25.0000 mg | ORAL_CAPSULE | Freq: Four times a day (QID) | ORAL | Status: DC | PRN

## 2018-07-11 MED ORDER — WITCH HAZEL-GLYCERIN EX PADS: 1.0000 "application " | MEDICATED_PAD | CUTANEOUS | Status: DC | PRN

## 2018-07-11 MED ORDER — NALOXONE HCL 0.4 MG/ML IJ SOLN: 0.4000 mg | INTRAMUSCULAR | Status: DC | PRN

## 2018-07-11 MED ORDER — OXYTOCIN 40 UNITS IN LACTATED RINGERS INFUSION - SIMPLE MED: 2.5000 [IU]/h | INTRAVENOUS | Status: AC

## 2018-07-11 MED ORDER — COCONUT OIL OIL: 1.0000 "application " | TOPICAL_OIL | Status: DC | PRN

## 2018-07-11 MED ORDER — PRENATAL MULTIVITAMIN CH
1.0000 | ORAL_TABLET | Freq: Every day | ORAL | Status: DC
  Administered 2018-07-11 – 2018-07-12 (×2): 1 via ORAL
  Filled 2018-07-11 (×2): qty 1

## 2018-07-11 MED ORDER — SIMETHICONE 80 MG PO CHEW
80.0000 mg | CHEWABLE_TABLET | Freq: Three times a day (TID) | ORAL | Status: DC
  Administered 2018-07-11 – 2018-07-12 (×5): 80 mg via ORAL
  Filled 2018-07-11 (×5): qty 1

## 2018-07-11 MED ORDER — TETANUS-DIPHTH-ACELL PERTUSSIS 5-2.5-18.5 LF-MCG/0.5 IM SUSP: 0.5000 mL | Freq: Once | INTRAMUSCULAR | Status: DC

## 2018-07-11 MED ORDER — OXYCODONE-ACETAMINOPHEN 5-325 MG PO TABS: 1.0000 | ORAL_TABLET | ORAL | Status: DC | PRN

## 2018-07-11 MED ORDER — SENNOSIDES-DOCUSATE SODIUM 8.6-50 MG PO TABS
2.0000 | ORAL_TABLET | ORAL | Status: DC
  Administered 2018-07-11 – 2018-07-12 (×2): 2 via ORAL
  Filled 2018-07-11 (×2): qty 2

## 2018-07-11 MED ORDER — LACTATED RINGERS IV SOLN
INTRAVENOUS | Status: DC
  Administered 2018-07-11 (×2): via INTRAVENOUS

## 2018-07-11 MED ORDER — ONDANSETRON HCL 4 MG/2ML IJ SOLN: 4.0000 mg | Freq: Three times a day (TID) | INTRAMUSCULAR | Status: DC | PRN

## 2018-07-11 MED ORDER — ZOLPIDEM TARTRATE 5 MG PO TABS: 5.0000 mg | ORAL_TABLET | Freq: Every evening | ORAL | Status: DC | PRN

## 2018-07-11 NOTE — Lactation Note (Signed)
This note was copied from a baby's chart. Lactation Consultation Note:  P1, infant is 12 hours old at 37.1 weeks less than 6 lbs.  Mother reports that infant has not had a good feeding. Staff nurse reports that infants cheeks dimple when suckling at breast.   Several attempt to latch infant . Infant on and off with a few suckles, no sustained latch.  Infant has a high palate. She has a short anterior frenula and a tight upper lip frenula.   Attempt to latch infant on the alternate breast in cross cradle hold. Infant again on and off with a few tugs.  Infant dimples cheeks and unable to sustain any depth.   Mother taught hand expression. Infant was given 1 ml of ebm with a a spoon. Infant placed STS with FOB.  Assist mother with pumping using a DEBP.  Advised mother to continue to post pump for 15 mins after each feeding attempt.   Discussed LPI behaviors and that infant is less than 6 lbs. Infant may need supplementing.  Discussed using a nipple shield for next feeding if infant unable to sustain latch.   Teaching with parents of soothing infant, feeding cues, cluster feeding. Advised mother  to breastfeed infant with all feeding cues and 8-12 times in 24 hours or more,  Mother was given Lactation brochure with information on all [place] services at [facility]. Mother to page for assistance with next feeding to be fit with a nipple shield.,   Patient Name: [name] [name] [id number] Date: 07/11/2018 Reason for consult: Initial assessment   Maternal Data Has patient been taught Hand Expression?: Yes Does the patient have breastfeeding experience prior to this delivery?: No  Feeding Feeding Type: Breast Milk  LATCH Score Latch: Repeated attempts needed to sustain latch, nipple held in mouth throughout feeding, stimulation needed to elicit sucking reflex.  Audible Swallowing: None  Type of Nipple: Everted at rest and after stimulation  Comfort (Breast/Nipple): Soft / non-tender  Hold  (Positioning): Full assist, staff holds infant at breast  LATCH Score: 5  Interventions Interventions: Breast feeding basics reviewed;Assisted with latch;Skin to skin;Hand express;Breast compression;Adjust position;Support pillows;Position options;Expressed milk;DEBP  Lactation Tools Discussed/Used Pump Review: Setup, frequency, and cleaning;Milk Storage Initiated by:: Jess Barters RN,IBCLC Date initiated:: 07/12/18   Consult Status Consult Status: Follow-up Date: 07/12/18 Follow-up type: In-patient    Jess Barters [facility] 07/11/2018, 1:32 PM

## 2018-07-11 NOTE — Anesthesia Postprocedure Evaluation (Signed)
Anesthesia Post Note  Patient: [name] [name]  Procedure(s) Performed: CESAREAN SECTION (N/A Abdomen)     Patient location during evaluation: Mother Baby Anesthesia Type: Spinal Level of consciousness: awake and alert Pain management: pain level controlled Vital Signs Assessment: post-procedure vital signs reviewed and stable Respiratory status: spontaneous breathing, nonlabored ventilation and respiratory function stable Cardiovascular status: stable Postop Assessment: no headache, no backache, no apparent nausea or vomiting, patient able to bend at knees, able to ambulate, spinal receding and adequate PO intake Anesthetic complications: no    Last Vitals:  Vitals:   07/11/18 0145 07/11/18 0459  BP: 114/73 114/68  Pulse: (!) 55 63  Resp: 16 16  Temp: 36.8 C 37.1 C  SpO2: 97% 95%    Last Pain:  Vitals:   07/11/18 0459  TempSrc: Oral  PainSc: 0-No pain   Pain Goal:                 [employer]

## 2018-07-11 NOTE — Addendum Note (Signed)
Addendum  created 07/11/18 0908 by Hewitt Blade, CRNA   Clinical Note Signed

## 2018-07-11 NOTE — Plan of Care (Signed)
Progressing toward goals. 

## 2018-07-11 NOTE — Lactation Note (Signed)
This note was copied from a baby's chart. Lactation Consultation Note  Patient Name: [name] [name] [id number] Date: 07/11/2018   P1, 8 hour female, ETI [place] entered room mom and infant asleep.  Maternal Data    Feeding    LATCH Score                   Interventions    Lactation Tools Discussed/Used     Consult Status      [name] 07/11/2018, 3:53 AM

## 2018-07-11 NOTE — Progress Notes (Addendum)
Subjective: Postpartum Day 1: Cesarean Delivery Patient reports tolerating PO.  No HA, CP/SOB, RUQ pain, or visual disturbance.  Objective: Vital signs in last 24 hours: Temp:  [97.6 F (36.4 C)-98.8 F (37.1 C)] 98.8 F (37.1 C) (12/22 0459) Pulse Rate:  [55-95] 63 (12/22 0459) Resp:  [13-21] 16 (12/22 0459) BP: (114-156)/(62-97) 114/68 (12/22 0459) SpO2:  [95 %-99 %] 95 % (12/22 0459)  Physical Exam:  General: alert, cooperative and appears stated age 27: appropriate Uterine Fundus: firm Incision: healing well, no dehiscence, pressure dressing removed and honeycomb is blood stained DVT Evaluation: No evidence of DVT seen on physical exam. Negative Homan's sign. No cords or calf tenderness.  Recent Labs    07/10/18 0138 07/11/18 0704  HGB 14.2 11.5*  HCT 40.3 33.9*    Assessment/Plan: Status post Cesarean section. Doing well postoperatively.  Continue current care. Pre-E without severe features-BP wnl since delivery.  No s/sx.  Continue to monitor. Replace honeycomb dressing  Linda Hedges 07/11/2018, 10:18 AM

## 2018-07-12 NOTE — Progress Notes (Signed)
Subjective: Postpartum Day 2: Cesarean Delivery Patient reports incisional pain, tolerating PO, + flatus and no problems voiding.    Objective: Vital signs in last 24 hours: Temp:  [97.9 F (36.6 C)-98.2 F (36.8 C)] 97.9 F (36.6 C) (12/23 0500) Pulse Rate:  [67-83] 82 (12/23 0500) Resp:  [18] 18 (12/22 2206) BP: (105-117)/(60-81) 108/72 (12/23 0500)  Physical Exam:  General: alert, cooperative and no distress Lochia: appropriate Uterine Fundus: firm Incision: healing well DVT Evaluation: No evidence of DVT seen on physical exam.  Dressing change x 2 Now dressing C&D Patient waiting on pain meds to prepare to go to [facility]  Recent Labs    07/10/18 0138 07/11/18 0704  HGB 14.2 11.5*  HCT 40.3 33.9*    Assessment/Plan: Status post Cesarean section. Doing well postoperatively.  Continue current care. Patient wants to go to [facility] prior to incision check. Will wait for pain meds>[facility] trip Patient looks good If dressing saturates then will examine Otherwise will check tomorrow  Shon Millet II 07/12/2018, 9:06 AM

## 2018-07-12 NOTE — Lactation Note (Signed)
This note was copied from a baby's chart. Lactation Consultation Note  Patient Name: [name] [name] [id number] Date: 07/12/2018 Reason for consult: Follow-up assessment;Infant < 6lbs;Early term 37-38.6wks Baby is at a 6% weight loss at 87 hours old.  Mom is using a nipple shield but does not notice milk in shield after feeding.  She pumped yesterday but none during the night or today.  Instructed to post pump every 3 hours to establish milk supply.  Discussed formula supplementation and parents agreeable.  Assisted mom with paced bottle feeding.  Plan is for mom to feed with feeding cues using nipple shield, Post pump every 3 hours and give 15+ mls of expressed milk/formula as supplement.  Encouraged to call for assist/concerns prn.  Maternal Data    Feeding Feeding Type: Breast Fed  LATCH Score                   Interventions    Lactation Tools Discussed/Used     Consult Status Consult Status: Follow-up Date: 07/13/18 Follow-up type: In-patient    Ave Filter 07/12/2018, 11:32 AM

## 2018-07-12 NOTE — Progress Notes (Signed)
Denied ibuprofen. Stated she will take it later

## 2018-07-13 MED ORDER — IBUPROFEN 800 MG PO TABS: 800.0000 mg | ORAL_TABLET | Freq: Four times a day (QID) | ORAL | 0 refills | Status: DC | PRN

## 2018-07-13 MED ORDER — ACETAMINOPHEN 325 MG PO TABS: 650.0000 mg | ORAL_TABLET | Freq: Four times a day (QID) | ORAL | 0 refills | Status: DC | PRN

## 2018-07-13 NOTE — Discharge Summary (Signed)
Obstetric Discharge Summary Reason for Admission: induction of labor Prenatal Procedures: none Intrapartum Procedures: cesarean: low cervical, transverse Postpartum Procedures: none Complications-Operative and Postpartum: none Hemoglobin  Date Value Ref Range Status  07/11/2018 11.5 (L) 12.0 - 15.0 g/dL Final   HCT  Date Value Ref Range Status  07/11/2018 33.9 (L) 36.0 - 46.0 % Final    Physical Exam:  General: alert, cooperative and no distress Lochia: appropriate Uterine Fundus: firm Incision: healing well DVT Evaluation: No evidence of DVT seen on physical exam.  Discharge Diagnoses: Term Pregnancy-delivered and Preelampsia  Discharge Information: Date: 07/13/2018 Activity: pelvic rest Diet: routine Medications: PNV and Ibuprofen Condition: stable Instructions: refer to practice specific booklet Discharge to: home   Newborn Data: Live born female  Birth Weight: 5 lb 15.1 oz (2696 g) APGAR: 4, 7  Newborn Delivery   Birth date/time:  [birth year]10/2020 19:31:00 Delivery type:  C-Section, Low Transverse Trial of labor:  Yes C-section categorization:  Primary     Home with mother.  Shon Millet II 07/13/2018, 4:09 PM

## 2018-07-13 NOTE — Lactation Note (Signed)
This note was copied from a baby's chart. Lactation Consultation Note  Patient Name: [name] [name] Date: 07/13/2018 Reason for consult: Follow-up assessment;Early term 25-38.6wks  Parents report that feeding documented at 0400 did not occur. Parents understand that early-term infant should feed q3hrs.   Parents had been concerned that maybe 15 mL was too much for infant to be drinking b/c an hour or so after feedings infant would spit up/cough and Dad would use bulb syringe. (Dad denies any concerning color change). Mom still does not see milk in the nipple shield & she has not noticed infant swallowing when at breast.   A slower-flow nipple was used & pacing was done. Infant comfortably took 31 mL in about 10 minutes. We placed infant skin-to-skin on Dad for a bit to promote upright position & gastric motility.   Parents have my # to call when ready for me to return.   Matthias Hughs [facility] 07/13/2018, 8:45 AM

## 2018-07-13 NOTE — Lactation Note (Addendum)
This note was copied from a baby's chart. Lactation Consultation Note  Patient Name: [name] [name] EUMPN'T Date: 07/13/2018 Reason for consult: Follow-up assessment;Early term 34-38.6wks  Mom was assisted with bottle-feeding "Amelia." Mom seems unsure of herself. I asked her if she was overwhelmed and she replied, "This is my first baby and I don't want to mess anything up." I reassured her.   Examples of slow-flow bottles were provided to parents. Mom has a Medela pump at home. I informed Mom that she can continue to offer the breast & follow with bottle feedings or she can pump & BO and we can then work on getting infant to breast once her milk has come to volume.   Mom strikes me as someone who is at higher risk for Postpartum Mood Disorders. Her husband will be home for 2 weeks, but then she will have no one to assist her, as other family members live out of town. I provided the brochure that lists [employer] & the [facility] with their days and times.  Matthias Hughs [facility] 07/13/2018, 11:38 AM

## 2018-07-15 ENCOUNTER — Encounter (HOSPITAL_COMMUNITY): Payer: Self-pay

## 2018-07-15 ENCOUNTER — Observation Stay (HOSPITAL_COMMUNITY)
Admission: AD | Admit: 2018-07-15 | Discharge: 2018-07-17 | Disposition: A | Discharge status: Home / Self Care | Payer: BLUE CROSS/BLUE SHIELD | Source: Ambulatory Visit | Attending: Obstetrics and Gynecology | Admitting: Obstetrics and Gynecology

## 2018-07-15 DIAGNOSIS — J189 Pneumonia, unspecified organism: Secondary | ICD-10-CM | POA: Diagnosis not present

## 2018-07-15 DIAGNOSIS — R059 Cough, unspecified: Secondary | ICD-10-CM

## 2018-07-15 DIAGNOSIS — R509 Fever, unspecified: Secondary | ICD-10-CM | POA: Diagnosis present

## 2018-07-15 DIAGNOSIS — R05 Cough: Secondary | ICD-10-CM

## 2018-07-15 LAB — INFLUENZA PANEL BY PCR (TYPE A & B)
Influenza A By PCR: NEGATIVE
Influenza B By PCR: NEGATIVE

## 2018-07-15 LAB — CBC WITH DIFFERENTIAL/PLATELET
BASOS PCT: 0 %
Basophils Absolute: 0 10*3/uL (ref 0.0–0.1)
Eosinophils Absolute: 0.3 10*3/uL (ref 0.0–0.5)
Eosinophils Relative: 3 %
HCT: 30.3 % — ABNORMAL LOW (ref 36.0–46.0)
Hemoglobin: 10.3 g/dL — ABNORMAL LOW (ref 12.0–15.0)
Lymphocytes Relative: 11 %
Lymphs Abs: 1.4 10*3/uL (ref 0.7–4.0)
MCH: 32.6 pg (ref 26.0–34.0)
MCHC: 34 g/dL (ref 30.0–36.0)
MCV: 95.9 fL (ref 80.0–100.0)
Monocytes Absolute: 0.8 10*3/uL (ref 0.1–1.0)
Monocytes Relative: 6 %
Neutro Abs: 10.3 10*3/uL — ABNORMAL HIGH (ref 1.7–7.7)
Neutrophils Relative %: 80 %
PLATELETS: 277 10*3/uL (ref 150–400)
RBC: 3.16 MIL/uL — ABNORMAL LOW (ref 3.87–5.11)
RDW: 12.5 % (ref 11.5–15.5)
WBC: 12.8 10*3/uL — ABNORMAL HIGH (ref 4.0–10.5)
nRBC: 0 % (ref 0.0–0.2)

## 2018-07-15 MED ORDER — ACETAMINOPHEN 500 MG PO TABS
1000.0000 mg | ORAL_TABLET | Freq: Once | ORAL | Status: AC
  Administered 2018-07-15: 1000 mg via ORAL
  Filled 2018-07-15: qty 2

## 2018-07-15 MED ORDER — SODIUM CHLORIDE 0.9 % IV BOLUS
1000.0000 mL | Freq: Once | INTRAVENOUS | Status: AC
  Administered 2018-07-15: 1000 mL via INTRAVENOUS

## 2018-07-15 NOTE — MAU Note (Signed)
Pt states she had a c/s on 12/21. States she started having a cough after she was discharged and developed a fever of 102 today. States she has not taken anything for the fever. Pt reports incisional pain, but not any worse than when she left the hospital. Denies recent sick contacts. Pt is breast and bottle feeding.

## 2018-07-15 NOTE — MAU Provider Note (Signed)
History     CSN: [id number]  Arrival date and time: 07/15/18 2221   First Provider Initiated Contact with Patient 07/15/18 2302      Chief Complaint  Patient presents with  . Postpartum Complications  . Fever   [name] [name] is a 27 y.o. G1P1001 who is 5 days S/P LTCS. She was DC home on 07/13/18 without complaint at that time. However, today she started to have a cough and was not feeling well. She denies any breast pain. She had a slight red spot on the right after pumping, but states that has resolved at this time. She reports that her bleeding has continued to decrease daily. She has the honeycomb dressing applied to c-section site.   Fever   This is a new problem. The current episode started today. The maximum temperature noted was 103 to 103.9 F. The temperature was taken using an oral thermometer. Treatments tried: percocet for post op pain.  The treatment provided no relief.  Risk factors comment:  Post operative    OB History    Gravida  1   Para  1   Term  1   Preterm      AB      Living  1     SAB      TAB      Ectopic      Multiple  0   Live Births  1           Past Medical History:  Diagnosis Date  . Medical history non-contributory     Past Surgical History:  Procedure Laterality Date  . CESAREAN SECTION N/A 07/10/2018   Procedure: CESAREAN SECTION;  Surgeon: Linda Hedges, DO;  Location: [facility];  Service: Obstetrics;  Laterality: N/A;  . WISDOM TOOTH EXTRACTION      Family History  Problem Relation Age of Onset  . Depression Sister   . Breast cancer Paternal Aunt   . Stroke Maternal Grandmother     Social History   Tobacco Use  . Smoking status: Never Smoker  . Smokeless tobacco: Never Used  Substance Use Topics  . Alcohol use: Never    Frequency: Never  . Drug use: Never    Allergies:  Allergies  Allergen Reactions  . Cefaclor Other (See Comments)    Childhood allergy  . Penicillins Other (See Comments)     Childhood allergy  . Sulfamethoxazole-Trimethoprim Other (See Comments)    Childhood allergy    Medications Prior to Admission  Medication Sig Dispense Refill Last Dose  . ibuprofen (ADVIL,MOTRIN) 800 MG tablet Take 1 tablet (800 mg total) by mouth every 6 (six) hours as needed for moderate pain. 90 tablet 0 07/15/2018 at Unknown time  . acetaminophen (TYLENOL) 325 MG tablet Take 2 tablets (650 mg total) by mouth every 6 (six) hours as needed for mild pain. 90 tablet 0   . Prenatal Vit-Fe Fumarate-FA (PRENATAL MULTIVITAMIN) TABS tablet Take 1 tablet by mouth daily at 12 noon.   07/08/2018    Review of Systems  Constitutional: Positive for fever.   Physical Exam   Blood pressure (!) 154/96, pulse (!) 122, temperature (!) 103.1 F (39.5 C), temperature source Oral, resp. rate 20, height 5\' 5"  (1.651 m), weight 77.6 kg, SpO2 95 %, unknown if currently breastfeeding.  Physical Exam  Nursing note and vitals reviewed. Constitutional: She is oriented to person, place, and time. She appears well-developed and well-nourished. No distress.  HENT:  Head: Normocephalic.  Cardiovascular:  Normal rate.  Respiratory: Effort normal. Right breast exhibits no mass, no skin change and no tenderness. Left breast exhibits no mass, no skin change and no tenderness.  GI: Soft. There is no abdominal tenderness. There is no rebound.  Musculoskeletal:        General: No tenderness or edema.  Neurological: She is alert and oriented to person, place, and time.  Skin: Skin is warm and dry.  Psychiatric: She has a normal mood and affect.    Results for orders placed or performed during the hospital encounter of 07/15/18 (from the past 24 hour(s))  Influenza panel by PCR (type A & B)     Status: None   Collection Time: 07/15/18 10:53 PM  Result Value Ref Range   Influenza A By PCR NEGATIVE NEGATIVE   Influenza B By PCR NEGATIVE NEGATIVE  Comprehensive metabolic panel     Status: Abnormal   Collection  Time: 07/15/18 11:05 PM  Result Value Ref Range   Sodium 136 135 - 145 mmol/L   Potassium 3.4 (L) 3.5 - 5.1 mmol/L   Chloride 107 98 - 111 mmol/L   CO2 21 (L) 22 - 32 mmol/L   Glucose, Bld 99 70 - 99 mg/dL   BUN 12 6 - 20 mg/dL   Creatinine, Ser 0.56 0.44 - 1.00 mg/dL   Calcium 7.7 (L) 8.9 - 10.3 mg/dL   Total Protein 5.6 (L) 6.5 - 8.1 g/dL   Albumin 2.5 (L) 3.5 - 5.0 g/dL   AST 19 15 - 41 U/L   ALT 27 0 - 44 U/L   Alkaline Phosphatase 67 38 - 126 U/L   Total Bilirubin 0.4 0.3 - 1.2 mg/dL   GFR calc non Af Amer >60 >60 mL/min   GFR calc Af Amer >60 >60 mL/min   Anion gap 8 5 - 15  CBC WITH DIFFERENTIAL     Status: Abnormal   Collection Time: 07/15/18 11:05 PM  Result Value Ref Range   WBC 12.8 (H) 4.0 - 10.5 K/uL   RBC 3.16 (L) 3.87 - 5.11 MIL/uL   Hemoglobin 10.3 (L) 12.0 - 15.0 g/dL   HCT 30.3 (L) 36.0 - 46.0 %   MCV 95.9 80.0 - 100.0 fL   MCH 32.6 26.0 - 34.0 pg   MCHC 34.0 30.0 - 36.0 g/dL   RDW 12.5 11.5 - 15.5 %   Platelets 277 150 - 400 K/uL   nRBC 0.0 0.0 - 0.2 %   Neutrophils Relative % 80 %   Neutro Abs 10.3 (H) 1.7 - 7.7 K/uL   Lymphocytes Relative 11 %   Lymphs Abs 1.4 0.7 - 4.0 K/uL   Monocytes Relative 6 %   Monocytes Absolute 0.8 0.1 - 1.0 K/uL   Eosinophils Relative 3 %   Eosinophils Absolute 0.3 0.0 - 0.5 K/uL   Basophils Relative 0 %   Basophils Absolute 0.0 0.0 - 0.1 K/uL  Lactic acid, plasma     Status: None   Collection Time: 07/15/18 11:05 PM  Result Value Ref Range   Lactic Acid, Venous 0.8 0.5 - 1.9 mmol/L  Urinalysis, Routine w reflex microscopic     Status: None   Collection Time: 07/15/18 11:51 PM  Result Value Ref Range   Color, Urine YELLOW YELLOW   APPearance CLEAR CLEAR   Specific Gravity, Urine 1.014 1.005 - 1.030   pH 6.0 5.0 - 8.0   Glucose, UA NEGATIVE NEGATIVE mg/dL   Hgb urine dipstick NEGATIVE NEGATIVE   Bilirubin  Urine NEGATIVE NEGATIVE   Ketones, ur NEGATIVE NEGATIVE mg/dL   Protein, ur NEGATIVE NEGATIVE mg/dL   Nitrite  NEGATIVE NEGATIVE   Leukocytes, UA NEGATIVE NEGATIVE   Dg Chest 2 View  Result Date: 07/16/2018 CLINICAL DATA:  Cough and fever. Status post Cesarian section July 10, 2018. EXAM: CHEST - 2 VIEW COMPARISON:  None. FINDINGS: Cardiac silhouette is upper limits of normal in size. Mediastinal silhouette is not suspicious. Mild interstitial prominence without pleural effusion or focal consolidation. No pneumothorax. Soft tissue planes and included osseous structures are non suspicious. IMPRESSION: 1. Borderline cardiomegaly. Interstitial prominence seen with pulmonary edema or atypical infection without focal consolidation. Electronically Signed   By: Elon Alas M.D.   On: 07/16/2018 00:54   MAU Course  Procedures  MDM Consult with Dr. Ihor Dow, will get chest x-ray.  Reviewed chest x-ray with HS 1:23 AM Consult with Dr. Julien Girt and reviewed current results and physical exam. Most likely dx seems to be flu even with negative flu swab at this time.  1:42 AM Dr. Julien Girt on the unit and assumes care of the patient   Assessment and Plan   [place], CNM  07/16/18  1:42 AM

## 2018-07-16 ENCOUNTER — Other Ambulatory Visit: Payer: Self-pay

## 2018-07-16 ENCOUNTER — Inpatient Hospital Stay (HOSPITAL_COMMUNITY): Payer: BLUE CROSS/BLUE SHIELD

## 2018-07-16 DIAGNOSIS — J189 Pneumonia, unspecified organism: Secondary | ICD-10-CM | POA: Diagnosis present

## 2018-07-16 LAB — COMPREHENSIVE METABOLIC PANEL
ALT: 27 U/L (ref 0–44)
AST: 19 U/L (ref 15–41)
Albumin: 2.5 g/dL — ABNORMAL LOW (ref 3.5–5.0)
Alkaline Phosphatase: 67 U/L (ref 38–126)
Anion gap: 8 (ref 5–15)
BUN: 12 mg/dL (ref 6–20)
CO2: 21 mmol/L — AB (ref 22–32)
Calcium: 7.7 mg/dL — ABNORMAL LOW (ref 8.9–10.3)
Chloride: 107 mmol/L (ref 98–111)
Creatinine, Ser: 0.56 mg/dL (ref 0.44–1.00)
GFR calc Af Amer: >60 mL/min (ref >60)
GFR calc non Af Amer: >60 mL/min (ref >60)
Glucose, Bld: 99 mg/dL (ref 70–99)
Potassium: 3.4 mmol/L — ABNORMAL LOW (ref 3.5–5.1)
SODIUM: 136 mmol/L (ref 135–145)
Total Bilirubin: 0.4 mg/dL (ref 0.3–1.2)
Total Protein: 5.6 g/dL — ABNORMAL LOW (ref 6.5–8.1)

## 2018-07-16 LAB — URINALYSIS, ROUTINE W REFLEX MICROSCOPIC
Bilirubin Urine: NEGATIVE
Glucose, UA: NEGATIVE mg/dL
Hgb urine dipstick: NEGATIVE
Ketones, ur: NEGATIVE mg/dL
LEUKOCYTES UA: NEGATIVE
Nitrite: NEGATIVE
Protein, ur: NEGATIVE mg/dL
Specific Gravity, Urine: 1.014 (ref 1.005–1.030)
pH: 6 (ref 5.0–8.0)

## 2018-07-16 LAB — CBC
HCT: 33.6 % — ABNORMAL LOW (ref 36.0–46.0)
Hemoglobin: 11.3 g/dL — ABNORMAL LOW (ref 12.0–15.0)
MCH: 32.4 pg (ref 26.0–34.0)
MCHC: 33.6 g/dL (ref 30.0–36.0)
MCV: 96.3 fL (ref 80.0–100.0)
Platelets: 315 10*3/uL (ref 150–400)
RBC: 3.49 MIL/uL — ABNORMAL LOW (ref 3.87–5.11)
RDW: 12.7 % (ref 11.5–15.5)
WBC: 11.9 10*3/uL — ABNORMAL HIGH (ref 4.0–10.5)
nRBC: 0 % (ref 0.0–0.2)

## 2018-07-16 LAB — LACTIC ACID, PLASMA: Lactic Acid, Venous: 0.8 mmol/L (ref 0.5–1.9)

## 2018-07-16 MED ORDER — LACTATED RINGERS IV SOLN
INTRAVENOUS | Status: DC
  Administered 2018-07-16 (×2): via INTRAVENOUS

## 2018-07-16 MED ORDER — LACTATED RINGERS IV SOLN
INTRAVENOUS | Status: DC | PRN
  Administered 2018-07-17: 02:00:00 via INTRAVENOUS

## 2018-07-16 MED ORDER — ADULT MULTIVITAMIN W/MINERALS CH
1.0000 | ORAL_TABLET | Freq: Every day | ORAL | Status: DC
  Filled 2018-07-16 (×3): qty 1

## 2018-07-16 MED ORDER — OSELTAMIVIR PHOSPHATE 75 MG PO CAPS
75.0000 mg | ORAL_CAPSULE | Freq: Two times a day (BID) | ORAL | Status: DC
  Administered 2018-07-16: 75 mg via ORAL
  Filled 2018-07-16: qty 1

## 2018-07-16 MED ORDER — ACETAMINOPHEN 325 MG PO TABS
650.0000 mg | ORAL_TABLET | Freq: Four times a day (QID) | ORAL | Status: DC | PRN
  Administered 2018-07-16: 650 mg via ORAL
  Filled 2018-07-16: qty 2

## 2018-07-16 MED ORDER — POLYETHYLENE GLYCOL 3350 17 G PO PACK
17.0000 g | PACK | Freq: Every day | ORAL | Status: DC | PRN
  Filled 2018-07-16: qty 1

## 2018-07-16 MED ORDER — LEVOFLOXACIN IN D5W 750 MG/150ML IV SOLN
750.0000 mg | INTRAVENOUS | Status: DC
  Administered 2018-07-16 – 2018-07-17 (×2): 750 mg via INTRAVENOUS
  Filled 2018-07-16 (×3): qty 150

## 2018-07-16 NOTE — Lactation Note (Signed)
Lactation Consultation Note  Patient Name: [name] [name] IYMEB'R Date: 07/16/2018   Visited with mom of a 51 day old infant, she's a P1. Mom was re-admitted to [facility] specialty care last night, she's exclusively pumping and bottle, BF was challenging for her due to baby's high palate. Mom is getting about 1 oz of breastmilk from both breasts but breast still feeling full after pumping every 2-3 hours for 15-20 minutes.  Breast look full and heavy upon examination, when LC did hand expression with mom, colostrum was seen but only 2-3 drops, breast still feeling full and not soft with noted indentations of LC's fingers on mom's breast, mom also has edema probably due to the onset of engorgement.  Feeding plan:  1. Mom will apply ice packs for 20 minutes on both breast prior pumping. RN Abby aware of plan to assist mom refilling her ice packs  2. Encouraged mom to pump every 2 hours during the day and at least once at night, a minimum of 8 pumping sessions in 24 hours 3. Mom will turn her EBM to her RN to be put in the fridge unit, dad will come to pick it up daily or every other day to take it to the baby at home  Mom reported all questions and concerns were answered, she is aware of [facility] services and will call PRN.  Maternal Data    Feeding      Interventions    Lactation Tools Discussed/Used     Consult Status      [name] [name] 07/16/2018, 4:20 PM

## 2018-07-16 NOTE — Plan of Care (Signed)
  Problem: Education: Goal: Knowledge of General Education information will improve Description Including pain rating scale, medication(s)/side effects and non-pharmacologic comfort measures Outcome: Progressing   Problem: Health Behavior/Discharge Planning: Goal: Ability to manage health-related needs will improve Outcome: Progressing   Problem: Clinical Measurements: Goal: Ability to maintain clinical measurements within normal limits will improve Outcome: Progressing Goal: Cardiovascular complication will be avoided Outcome: Progressing   Problem: Activity: Goal: Risk for activity intolerance will decrease Outcome: Progressing   Problem: Nutrition: Goal: Adequate nutrition will be maintained Outcome: Progressing   Problem: Coping: Goal: Level of anxiety will decrease Outcome: Progressing   Problem: Elimination: Goal: Will not experience complications related to bowel motility Outcome: Progressing

## 2018-07-16 NOTE — Progress Notes (Signed)
S// feels some better this am  O// BP (!) 148/87   Pulse 84   Temp 98.7 F (37.1 C) (Oral)   Resp 20   Ht 5\' 5"  (1.651 m)   Wt 77.6 kg   SpO2 97%   BMI 28.46 kg/m   Reviewed labs, [facility] pending  A+P// post OP CS with prob viral syndr, screen NEG for FLU A+B CXR findings noted, not def pnemonia Will cont ABX Discussed TAMIFLU

## 2018-07-16 NOTE — H&P (Addendum)
Cosign Needed                          [name] [name] is a 27 y.o. G1P1001 who is 5 days S/P LTCS. She was DC home on 07/13/18 without complaint at that time. However, today she started to have a cough and was not feeling well. She denies any breast pain. She had a slight red spot on the right after pumping, but states that has resolved at this time. She reports that her bleeding has continued to decrease daily. She has the honeycomb dressing applied to c-section site.   Denies dysuria, abdominal & breast pain.  Pt c/o constipation - has not had a BM since surgery.  No n/v/d.    Fever   This is a new problem. The current episode started today. The maximum temperature noted was 103 to 103.9 F. The temperature was taken using an oral thermometer. Treatments tried: percocet for post op pain.  The treatment provided no relief.  Risk factors comment:  Post operative            OB History    Gravida  1   Para  1   Term  1   Preterm      AB      Living  1     SAB      TAB      Ectopic      Multiple  0   Live Births  1               Past Medical History:  Diagnosis Date  . Medical history non-contributory          Past Surgical History:  Procedure Laterality Date  . CESAREAN SECTION N/A 07/10/2018   Procedure: CESAREAN SECTION;  Surgeon: Linda Hedges, DO;  Location: [facility];  Service: Obstetrics;  Laterality: N/A;  . WISDOM TOOTH EXTRACTION           Family History  Problem Relation Age of Onset  . Depression Sister   . Breast cancer Paternal Aunt   . Stroke Maternal Grandmother     Social History        Tobacco Use  . Smoking status: Never Smoker  . Smokeless tobacco: Never Used  Substance Use Topics  . Alcohol use: Never    Frequency: Never  . Drug use: Never    Allergies:       Allergies  Allergen Reactions  . Cefaclor Other (See Comments)    Childhood allergy  . Penicillins Other  (See Comments)    Childhood allergy  . Sulfamethoxazole-Trimethoprim Other (See Comments)    Childhood allergy           Medications Prior to Admission  Medication Sig Dispense Refill Last Dose  . ibuprofen (ADVIL,MOTRIN) 800 MG tablet Take 1 tablet (800 mg total) by mouth every 6 (six) hours as needed for moderate pain. 90 tablet 0 07/15/2018 at Unknown time  . acetaminophen (TYLENOL) 325 MG tablet Take 2 tablets (650 mg total) by mouth every 6 (six) hours as needed for mild pain. 90 tablet 0   . Prenatal Vit-Fe Fumarate-FA (PRENATAL MULTIVITAMIN) TABS tablet Take 1 tablet by mouth daily at 12 noon.   07/08/2018    Review of Systems  Constitutional: Positive for fever.   Physical Exam   Blood pressure (!) 154/96, pulse (!) 122, temperature (!) 103.1 F (39.5 C), temperature source Oral,  resp. rate 20, height 5\' 5"  (1.651 m), weight 77.6 kg, SpO2 95 %, unknown if currently breastfeeding.  Physical Exam  Nursing note and vitals reviewed. Constitutional: She is oriented to person, place, and time. She appears well-developed and well-nourished. No distress.  HENT:  Head: Normocephalic.  Cardiovascular: Normal rate.  Respiratory: Effort normal. Right breast exhibits no mass, no skin change and no tenderness. Left breast exhibits no mass, no skin change and no tenderness.  GI: Soft. There is no abdominal tenderness. There is no rebound.  Musculoskeletal:        General: No tenderness or edema.  Neurological: She is alert and oriented to person, place, and time.  Skin: Skin is warm and dry.  Psychiatric: She has a normal mood and affect.    LabResultsLast24Hours       Results for orders placed or performed during the hospital encounter of 07/15/18 (from the past 24 hour(s))  Influenza panel by PCR (type A & B)     Status: None   Collection Time: 07/15/18 10:53 PM  Result Value Ref Range   Influenza A By PCR NEGATIVE NEGATIVE   Influenza B By PCR NEGATIVE  NEGATIVE  Comprehensive metabolic panel     Status: Abnormal   Collection Time: 07/15/18 11:05 PM  Result Value Ref Range   Sodium 136 135 - 145 mmol/L   Potassium 3.4 (L) 3.5 - 5.1 mmol/L   Chloride 107 98 - 111 mmol/L   CO2 21 (L) 22 - 32 mmol/L   Glucose, Bld 99 70 - 99 mg/dL   BUN 12 6 - 20 mg/dL   Creatinine, Ser 0.56 0.44 - 1.00 mg/dL   Calcium 7.7 (L) 8.9 - 10.3 mg/dL   Total Protein 5.6 (L) 6.5 - 8.1 g/dL   Albumin 2.5 (L) 3.5 - 5.0 g/dL   AST 19 15 - 41 U/L   ALT 27 0 - 44 U/L   Alkaline Phosphatase 67 38 - 126 U/L   Total Bilirubin 0.4 0.3 - 1.2 mg/dL   GFR calc non Af Amer >60 >60 mL/min   GFR calc Af Amer >60 >60 mL/min   Anion gap 8 5 - 15  CBC WITH DIFFERENTIAL     Status: Abnormal   Collection Time: 07/15/18 11:05 PM  Result Value Ref Range   WBC 12.8 (H) 4.0 - 10.5 K/uL   RBC 3.16 (L) 3.87 - 5.11 MIL/uL   Hemoglobin 10.3 (L) 12.0 - 15.0 g/dL   HCT 30.3 (L) 36.0 - 46.0 %   MCV 95.9 80.0 - 100.0 fL   MCH 32.6 26.0 - 34.0 pg   MCHC 34.0 30.0 - 36.0 g/dL   RDW 12.5 11.5 - 15.5 %   Platelets 277 150 - 400 K/uL   nRBC 0.0 0.0 - 0.2 %   Neutrophils Relative % 80 %   Neutro Abs 10.3 (H) 1.7 - 7.7 K/uL   Lymphocytes Relative 11 %   Lymphs Abs 1.4 0.7 - 4.0 K/uL   Monocytes Relative 6 %   Monocytes Absolute 0.8 0.1 - 1.0 K/uL   Eosinophils Relative 3 %   Eosinophils Absolute 0.3 0.0 - 0.5 K/uL   Basophils Relative 0 %   Basophils Absolute 0.0 0.0 - 0.1 K/uL  Lactic acid, plasma     Status: None   Collection Time: 07/15/18 11:05 PM  Result Value Ref Range   Lactic Acid, Venous 0.8 0.5 - 1.9 mmol/L  Urinalysis, Routine w reflex microscopic     Status:  None   Collection Time: 07/15/18 11:51 PM  Result Value Ref Range   Color, Urine YELLOW YELLOW   APPearance CLEAR CLEAR   Specific Gravity, Urine 1.014 1.005 - 1.030   pH 6.0 5.0 - 8.0   Glucose, UA NEGATIVE NEGATIVE mg/dL   Hgb urine dipstick NEGATIVE NEGATIVE     Bilirubin Urine NEGATIVE NEGATIVE   Ketones, ur NEGATIVE NEGATIVE mg/dL   Protein, ur NEGATIVE NEGATIVE mg/dL   Nitrite NEGATIVE NEGATIVE   Leukocytes, UA NEGATIVE NEGATIVE     Dg Chest 2 View  Result Date: 07/16/2018 CLINICAL DATA:  Cough and fever. Status post Cesarian section July 10, 2018. EXAM: CHEST - 2 VIEW COMPARISON:  None. FINDINGS: Cardiac silhouette is upper limits of normal in size. Mediastinal silhouette is not suspicious. Mild interstitial prominence without pleural effusion or focal consolidation. No pneumothorax. Soft tissue planes and included osseous structures are non suspicious. IMPRESSION: 1. Borderline cardiomegaly. Interstitial prominence seen with pulmonary edema or atypical infection without focal consolidation. Electronically Signed   By: Elon Alas M.D.   On: 07/16/2018 00:54    A/P:  Pneumonia Admit IVF IV antibiotics

## 2018-07-17 LAB — COMPREHENSIVE METABOLIC PANEL
ALT: 24 U/L (ref 0–44)
AST: 16 U/L (ref 15–41)
Albumin: 2.7 g/dL — ABNORMAL LOW (ref 3.5–5.0)
Alkaline Phosphatase: 60 U/L (ref 38–126)
Anion gap: 7 (ref 5–15)
BUN: 9 mg/dL (ref 6–20)
CO2: 24 mmol/L (ref 22–32)
Calcium: 8.4 mg/dL — ABNORMAL LOW (ref 8.9–10.3)
Chloride: 107 mmol/L (ref 98–111)
Creatinine, Ser: 0.64 mg/dL (ref 0.44–1.00)
GFR calc Af Amer: >60 mL/min (ref >60)
GFR calc non Af Amer: >60 mL/min (ref >60)
Glucose, Bld: 96 mg/dL (ref 70–99)
Potassium: 3.6 mmol/L (ref 3.5–5.1)
Sodium: 138 mmol/L (ref 135–145)
Total Bilirubin: 0.5 mg/dL (ref 0.3–1.2)
Total Protein: 6.2 g/dL — ABNORMAL LOW (ref 6.5–8.1)

## 2018-07-17 MED ORDER — LEVOFLOXACIN 750 MG PO TABS: 750.0000 mg | ORAL_TABLET | Freq: Every day | ORAL | 0 refills | Status: AC

## 2018-07-17 NOTE — Lactation Note (Signed)
Lactation Consultation Note  Patient Name: [name] [name] YHOOI'L Date: 07/17/2018   Visited with Mom on day of discharge after being admitted for fever at 6 days post partum.    Mom on antibiotics for possible pneumonia.  Breasts are much softer after she has been regularly double pumping.  Last pumping, she obtained 2oz.    RN noted a reddened area on left breast in the outer quadrant.   LC unable to observe any area of erythema or warmth on either breasts.  Reviewed benefits of warm, moist compresses prior to and during pumping, along with breast massage. Reviewed signs and symptoms of mastitis.   Encouraged Mom to take a probiotic to help prevent yeast.  Plan to continue with STS as much as possible, and regular double pumping 8-12 times per 24 hrs.  Talked about pumping every 1.5-3 hrs with one 4 hr stretch at night.  Talked about our OP lactation support offered, encouraged mom to call prn.     Broadus John 07/17/2018, 10:53 AM

## 2018-07-17 NOTE — Discharge Summary (Signed)
Physician Discharge Summary  Patient ID: [name] [name] MRN: [id number] DOB/AGE: Jan 25, 1991 27 y.o.  Admit date: 07/15/2018 Discharge date: 07/17/2018  Admission Diagnoses:POST PARTUM PNEUMONIA/BRONCHITIS, VIRAL URI  Discharge Diagnoses: SAME Active Problems:   Pneumonia   Discharged Condition: good  Hospital Course: adm with T - 103 post op CS, eval neg except ? Early pneumonia, Started on Levaquin IV , with FLU A+B NEG, on hospt day 2, afeb, lungs clear and feeling much better  Consults: None  Significant Diagnostic Studies: labs:  CBC    Component Value Date/Time   WBC 11.9 (H) 07/16/2018 0542   RBC 3.49 (L) 07/16/2018 0542   HGB 11.3 (L) 07/16/2018 0542   HCT 33.6 (L) 07/16/2018 0542   PLT 315 07/16/2018 0542   MCV 96.3 07/16/2018 0542   MCH 32.4 07/16/2018 0542   MCHC 33.6 07/16/2018 0542   RDW 12.7 07/16/2018 0542   LYMPHSABS 1.4 07/15/2018 2305   MONOABS 0.8 07/15/2018 2305   EOSABS 0.3 07/15/2018 2305   BASOSABS 0.0 07/15/2018 2305       Treatments: antibiotics: Levaquin  Discharge Exam: Blood pressure (!) 135/94, pulse 62, temperature 98.5 F (36.9 C), temperature source Oral, resp. rate 19, height 5\' 5"  (1.651 m), weight 77.6 kg, SpO2 98 %, unknown if currently breastfeeding. lungs clear, breasts NEGabd soft, INC C/D  Disposition: Discharge disposition: 01-Home or Self Care        Allergies as of 07/17/2018      Reactions   Cefaclor Other (See Comments)   Childhood allergy   Penicillins Other (See Comments)   Childhood allergy   Sulfamethoxazole-trimethoprim Other (See Comments)   Childhood allergy      Medication List    TAKE these medications   acetaminophen 325 MG tablet Commonly known as:  TYLENOL Take 2 tablets (650 mg total) by mouth every 6 (six) hours as needed for mild pain.   ibuprofen 800 MG tablet Commonly known as:  ADVIL,MOTRIN Take 1 tablet (800 mg total) by mouth every 6 (six) hours as needed for moderate pain.    levofloxacin 750 MG tablet Commonly known as:  LEVAQUIN Take 1 tablet (750 mg total) by mouth daily for 5 days.   prenatal multivitamin Tabs tablet Take 1 tablet by mouth daily at 12 noon.      Follow-up Information    [place] Follow up.   Why:  she has appt sched for this week Contact information: [address] [place] [address] [phone]           Signed: Margarette Asal 07/17/2018, 7:49 AM

## 2018-07-21 LAB — CULTURE, BLOOD (ROUTINE X 2)
CULTURE: NO GROWTH
CULTURE: NO GROWTH

## 2019-05-03 IMAGING — CR DG CHEST 2V
2 series · 2 of 2 positions shown · non-contrast
Comparison: None.

CLINICAL DATA: Cough and fever. Status post Cesarian section
July 10, 2018.

EXAM:
CHEST - 2 VIEW

[chest pa]
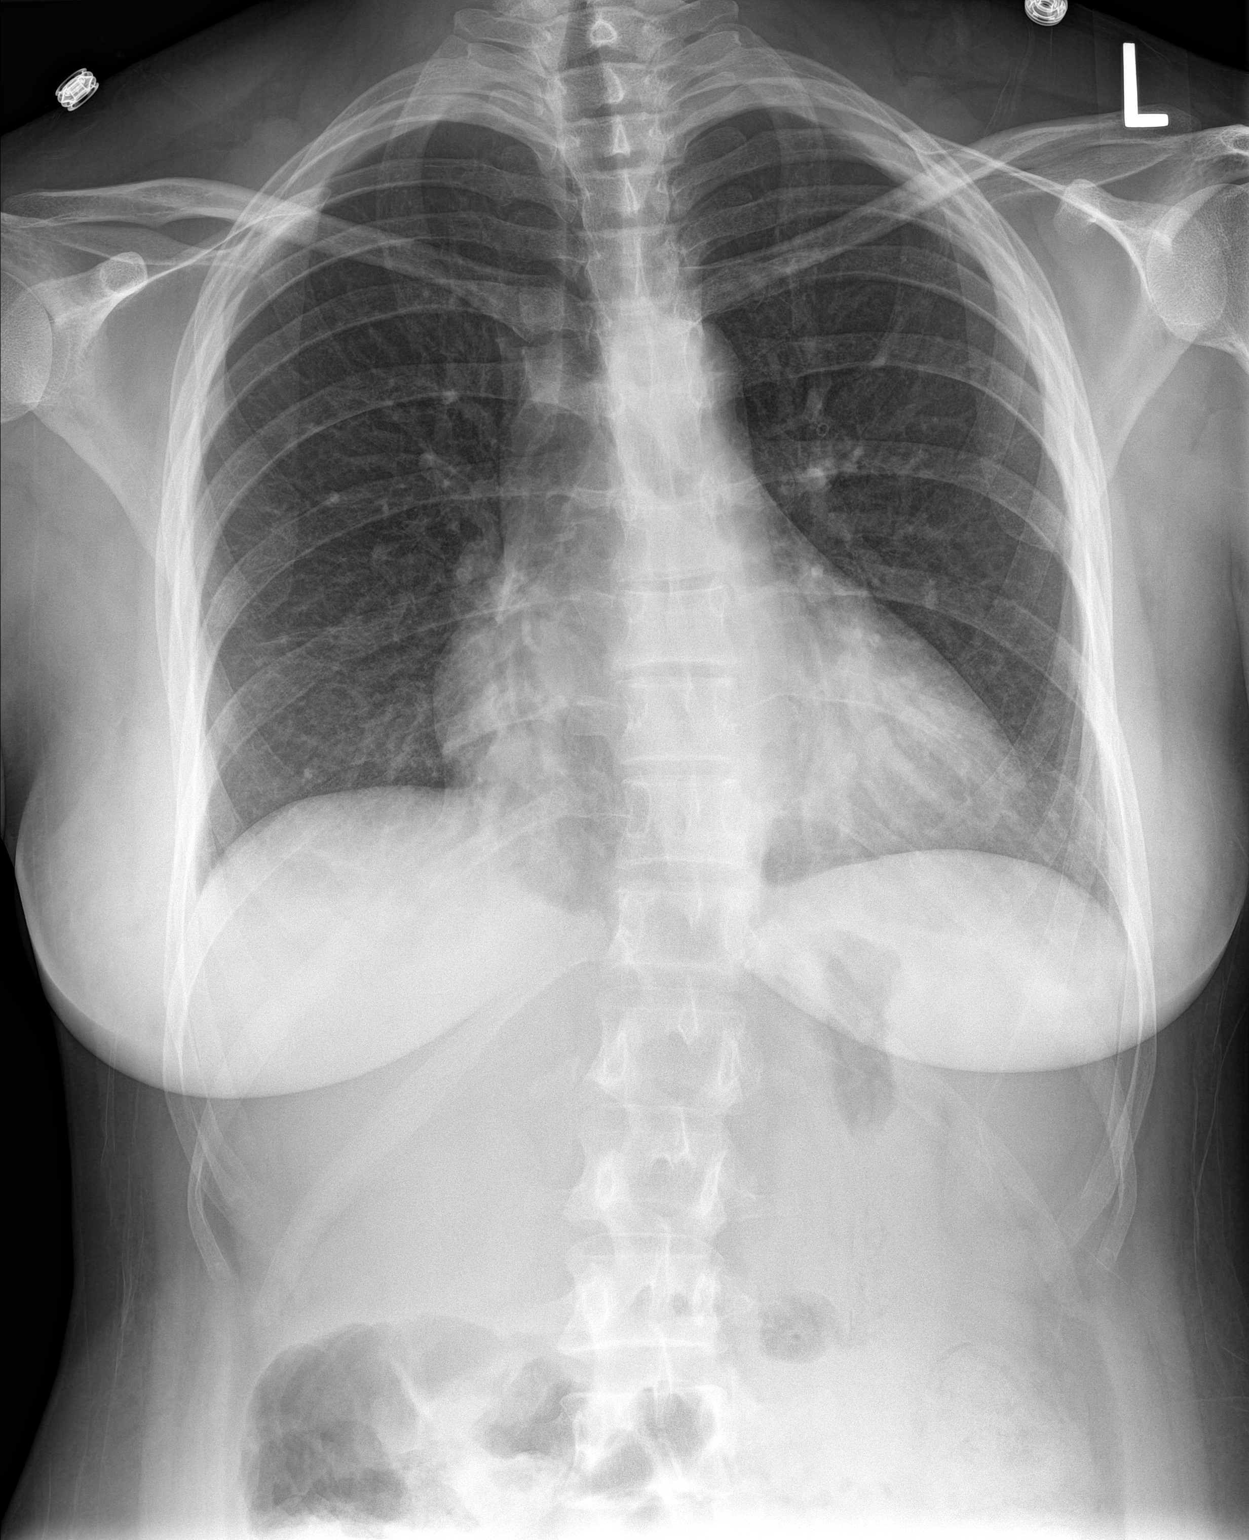

[chest lat]
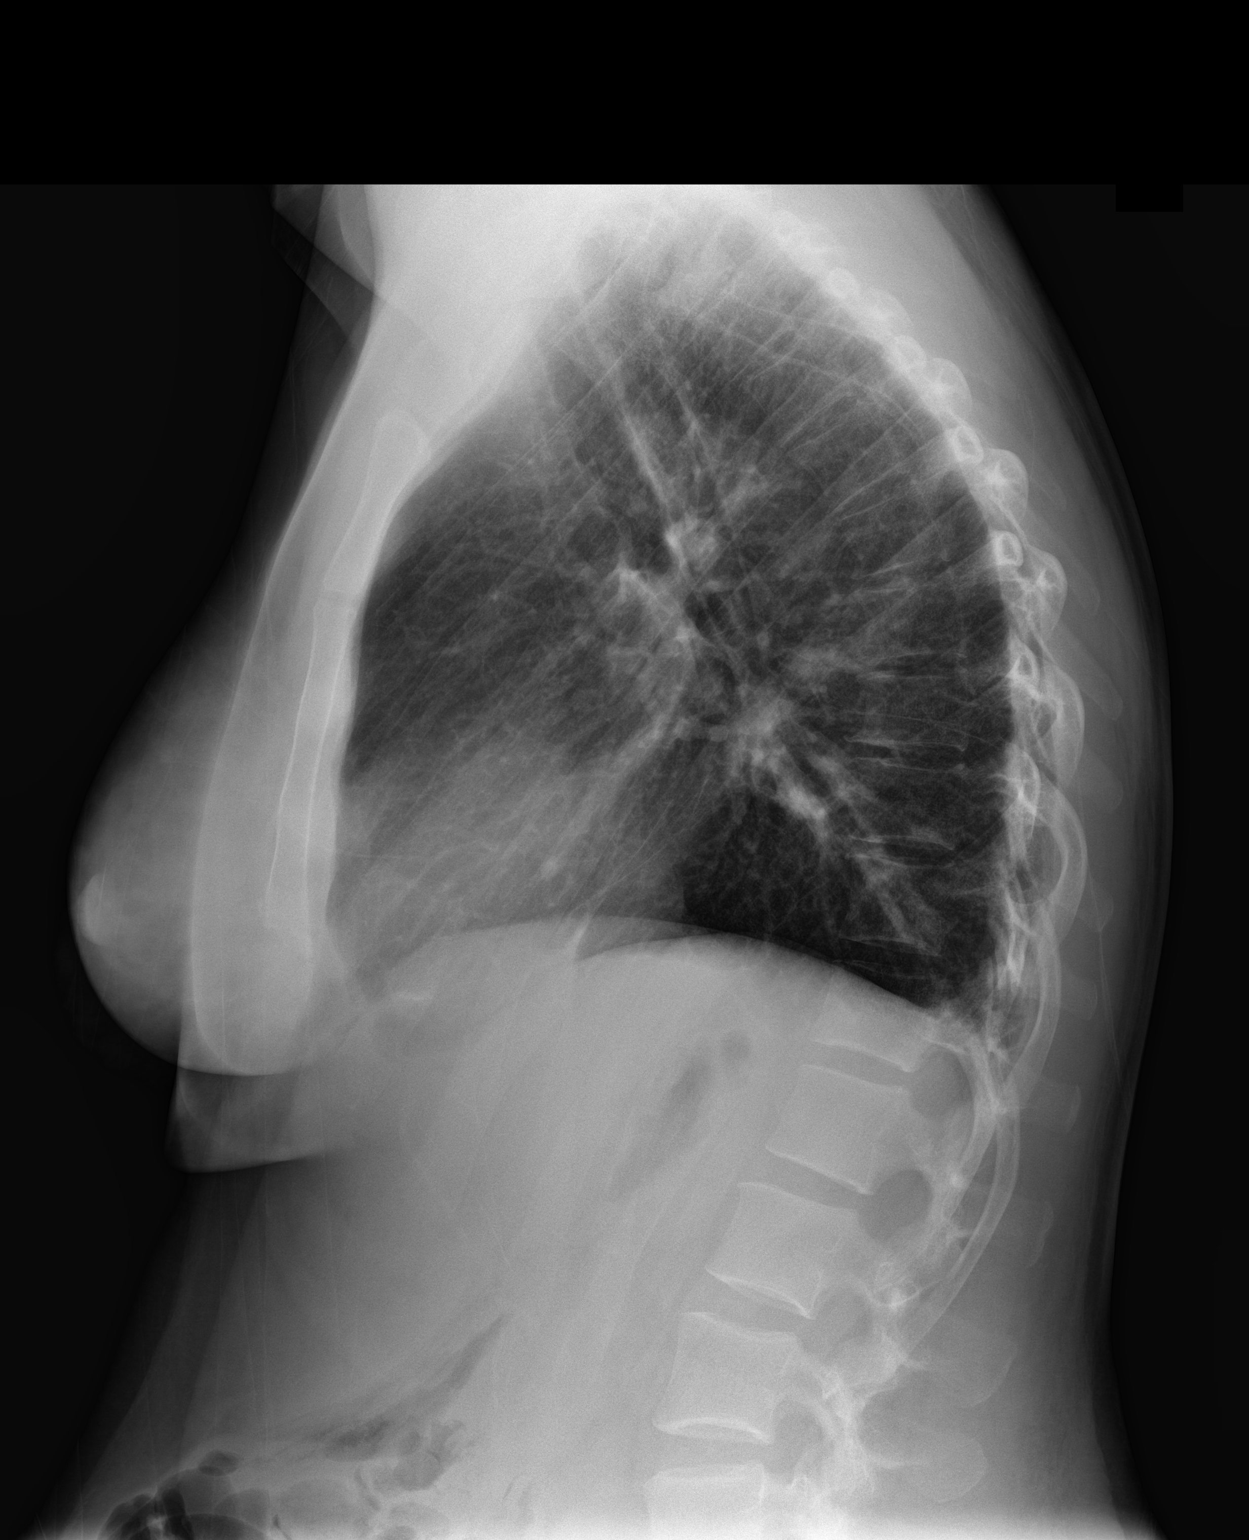

[2 of 2 positions shown; findings below may reference images not displayed]

FINDINGS: Cardiac silhouette is upper limits of normal in size. Mediastinal
silhouette is not suspicious. Mild interstitial prominence without
pleural effusion or focal consolidation. No pneumothorax. Soft
tissue planes and included osseous structures are non suspicious.
IMPRESSION: 1. Borderline cardiomegaly. Interstitial prominence seen with
pulmonary edema or atypical infection without focal consolidation.

## 2019-05-30 ENCOUNTER — Ambulatory Visit: Payer: BLUE CROSS/BLUE SHIELD | Admitting: Physician Assistant

## 2019-06-03 ENCOUNTER — Other Ambulatory Visit: Payer: Self-pay

## 2019-06-03 ENCOUNTER — Encounter: Payer: Self-pay | Admitting: Physician Assistant

## 2019-06-03 ENCOUNTER — Ambulatory Visit (INDEPENDENT_AMBULATORY_CARE_PROVIDER_SITE_OTHER): Payer: Managed Care, Other (non HMO) | Admitting: Physician Assistant

## 2019-06-03 VITALS — BP 130/80 | HR 88 | Temp 98.4°F | Ht 65.0 in | Wt 129.5 lb

## 2019-06-03 DIAGNOSIS — Z136 Encounter for screening for cardiovascular disorders: Secondary | ICD-10-CM

## 2019-06-03 DIAGNOSIS — Z0000 Encounter for general adult medical examination without abnormal findings: Secondary | ICD-10-CM

## 2019-06-03 DIAGNOSIS — Z13220 Encounter for screening for lipoid disorders: Secondary | ICD-10-CM | POA: Diagnosis not present

## 2019-06-03 LAB — COMPREHENSIVE METABOLIC PANEL
ALT: 7 U/L (ref 0–35)
AST: 11 U/L (ref 0–37)
Albumin: 4 g/dL (ref 3.5–5.2)
Alkaline Phosphatase: 54 U/L (ref 39–117)
BUN: 10 mg/dL (ref 6–23)
CO2: 29 mEq/L (ref 19–32)
Calcium: 8.9 mg/dL (ref 8.4–10.5)
Chloride: 107 mEq/L (ref 96–112)
Creatinine, Ser: 0.67 mg/dL (ref 0.40–1.20)
GFR: 102.83 mL/min (ref >60.00)
Glucose, Bld: 89 mg/dL (ref 70–99)
Potassium: 4.6 mEq/L (ref 3.5–5.1)
Sodium: 141 mEq/L (ref 135–145)
Total Bilirubin: 0.5 mg/dL (ref 0.2–1.2)
Total Protein: 6.2 g/dL (ref 6.0–8.3)

## 2019-06-03 LAB — CBC WITH DIFFERENTIAL/PLATELET
Basophils Absolute: 0.1 10*3/uL (ref 0.0–0.1)
Basophils Relative: 1.5 % (ref 0.0–3.0)
Eosinophils Absolute: 0.3 10*3/uL (ref 0.0–0.7)
Eosinophils Relative: 5.6 % — ABNORMAL HIGH (ref 0.0–5.0)
HCT: 40.2 % (ref 36.0–46.0)
Hemoglobin: 13.7 g/dL (ref 12.0–15.0)
Lymphocytes Relative: 31.4 % (ref 12.0–46.0)
Lymphs Abs: 1.4 10*3/uL (ref 0.7–4.0)
MCHC: 34 g/dL (ref 30.0–36.0)
MCV: 90.3 fl (ref 78.0–100.0)
Monocytes Absolute: 0.4 10*3/uL (ref 0.1–1.0)
Monocytes Relative: 9.5 % (ref 3.0–12.0)
Neutro Abs: 2.3 10*3/uL (ref 1.4–7.7)
Neutrophils Relative %: 52 % (ref 43.0–77.0)
Platelets: 335 10*3/uL (ref 150.0–400.0)
RBC: 4.45 Mil/uL (ref 3.87–5.11)
RDW: 12.7 % (ref 11.5–15.5)
WBC: 4.5 10*3/uL (ref 4.0–10.5)

## 2019-06-03 LAB — LIPID PANEL
Cholesterol: 142 mg/dL (ref 0–200)
HDL: 48.1 mg/dL (ref >39.00)
LDL Cholesterol: 83 mg/dL (ref 0–99)
NonHDL: 93.74
Total CHOL/HDL Ratio: 3
Triglycerides: 52 mg/dL (ref 0.0–149.0)
VLDL: 10.4 mg/dL (ref 0.0–40.0)

## 2019-06-03 NOTE — Progress Notes (Signed)
[name] [name] is a 28 y.o. female here to Establish care..  I acted as a Education administrator for [employer], PA-C Anselmo Pickler, LPN  History of Present Illness:   Chief Complaint  Patient presents with  . Establish Care  . Annual Exam    Acute Concerns: None  Chronic Issues: None  Health Maintenance: Immunizations -- UTD Colonoscopy -- N/A Mammogram -- N/A PAP -- UTD, 2019 Normal per pt , will obtain records Bone Density --N/A Diet --usually skips breakfast and eats normal meals for lunch and dinner Caffeine intake --coffee daily Sleep habits --no issues, sleeps from 11 PM to 6 a Exercise --none Weight -- Weight: 129 lb 8 oz (58.7 kg)  Mood --overall okay, is currently dealing with a little bit of grieving from recent death of her cat and grandmother Alcohol --none excessive Tobacco --none  Depression screen [facility] 2/9 06/03/2019  Decreased Interest 0  Down, Depressed, Hopeless 0  PHQ - 2 Score 0    No flowsheet data found.   Other providers/specialists: Patient Care Team: Inda Coke, [place] as PCP - General (Physician Assistant) Molli Posey, MD as Consulting Physician (Obstetrics and Gynecology)   Past Medical History:  Diagnosis Date  . Medical history non-contributory   . Preeclampsia, third trimester      Social History   Socioeconomic History  . Marital status: Married    Spouse name: Not on file  . Number of children: Not on file  . Years of education: Not on file  . Highest education level: Not on file  Occupational History  . Not on file  Social Needs  . Financial resource strain: Not on file  . Food insecurity    Worry: Not on file    Inability: Not on file  . Transportation needs    Medical: No    Non-medical: No  Tobacco Use  . Smoking status: Never Smoker  . Smokeless tobacco: Never Used  Substance and Sexual Activity  . Alcohol use: Never    Frequency: Never  . Drug use: Never  . Sexual activity: Yes    Birth  control/protection: Condom  Lifestyle  . Physical activity    Days per week: Not on file    Minutes per session: Not on file  . Stress: Not on file  Relationships  . Social Herbalist on phone: Not on file    Gets together: Not on file    Attends religious service: Not on file    Active member of club or organization: Not on file    Attends meetings of clubs or organizations: Not on file    Relationship status: Not on file  . Intimate partner violence    Fear of current or ex partner: Not on file    Emotionally abused: Not on file    Physically abused: Not on file    Forced sexual activity: Not on file  Other Topics Concern  . Not on file  Social History Narrative   Customer service coordinator   Married, 1 child born in 2019    Past Surgical History:  Procedure Laterality Date  . CESAREAN SECTION N/A 07/10/2018   Procedure: CESAREAN SECTION;  Surgeon: Linda Hedges, DO;  Location: [facility];  Service: Obstetrics;  Laterality: N/A;  . WISDOM TOOTH EXTRACTION      Family History  Problem Relation Age of Onset  . Depression Sister   . Breast cancer Paternal Aunt   . Stroke Maternal Grandmother   .  Colon cancer Neg Hx     Allergies  Allergen Reactions  . Cefaclor Other (See Comments)    Childhood allergy  . Penicillins Other (See Comments)    Childhood allergy  . Sulfamethoxazole-Trimethoprim Other (See Comments)    Childhood allergy     Current Medications:   Current Outpatient Medications:  .  acetaminophen (TYLENOL) 325 MG tablet, Take 2 tablets (650 mg total) by mouth every 6 (six) hours as needed for mild pain., Disp: 90 tablet, Rfl: 0 .  ibuprofen (ADVIL,MOTRIN) 800 MG tablet, Take 1 tablet (800 mg total) by mouth every 6 (six) hours as needed for moderate pain., Disp: 90 tablet, Rfl: 0   Review of Systems:   Review of Systems  Constitutional: Negative for chills, fever, malaise/fatigue and weight loss.  HENT: Negative for hearing  loss, sinus pain and sore throat.   Respiratory: Negative for cough and hemoptysis.   Cardiovascular: Negative for chest pain, palpitations, leg swelling and PND.  Gastrointestinal: Negative for abdominal pain, constipation, diarrhea, heartburn, nausea and vomiting.  Genitourinary: Negative for dysuria, frequency and urgency.  Musculoskeletal: Negative for back pain, myalgias and neck pain.  Skin: Negative for itching and rash.  Neurological: Negative for dizziness, tingling, seizures and headaches.  Endo/Heme/Allergies: Negative for polydipsia.  Psychiatric/Behavioral: Negative for depression. The patient is not nervous/anxious.     Vitals:   Vitals:   06/03/19 0806  BP: 130/80  Pulse: 88  Temp: 98.4 F (36.9 C)  TempSrc: Temporal  SpO2: 96%  Weight: 129 lb 8 oz (58.7 kg)  Height: 5\' 5"  (1.651 m)      Body mass index is 21.55 kg/m.  Physical Exam:   Physical Exam Vitals signs and nursing note reviewed.  Constitutional:      General: She is not in acute distress.    Appearance: Normal appearance. She is well-developed. She is not ill-appearing or toxic-appearing.  HENT:     Head: Normocephalic and atraumatic.     Right Ear: Tympanic membrane, ear canal and external ear normal. Tympanic membrane is not erythematous, retracted or bulging.     Left Ear: Tympanic membrane, ear canal and external ear normal. Tympanic membrane is not erythematous, retracted or bulging.  Eyes:     General: Lids are normal.     Conjunctiva/sclera: Conjunctivae normal.     Pupils: Pupils are equal, round, and reactive to light.  Neck:     Musculoskeletal: Full passive range of motion without pain.     Trachea: Trachea normal.  Cardiovascular:     Rate and Rhythm: Normal rate and regular rhythm.     Heart sounds: Normal heart sounds, S1 normal and S2 normal.  Pulmonary:     Effort: Pulmonary effort is normal. No tachypnea or respiratory distress.     Breath sounds: Normal breath sounds. No  decreased breath sounds, wheezing, rhonchi or rales.  Abdominal:     General: Bowel sounds are normal.     Palpations: Abdomen is soft.     Tenderness: There is no abdominal tenderness.  Musculoskeletal: Normal range of motion.  Lymphadenopathy:     Cervical: No cervical adenopathy.  Skin:    General: Skin is warm and dry.  Neurological:     Mental Status: She is alert.     GCS: GCS eye subscore is 4. GCS verbal subscore is 5. GCS motor subscore is 6.     Cranial Nerves: No cranial nerve deficit.     Sensory: No sensory deficit.  Deep Tendon Reflexes: Reflexes are normal and symmetric.  Psychiatric:        Speech: Speech normal.        Behavior: Behavior normal. Behavior is cooperative.      Assessment and Plan:   [name] [name] was seen today for establish care and annual exam.  Diagnoses and all orders for this visit:  Routine physical examination Today patient counseled on age appropriate routine health concerns for screening and prevention, each reviewed and up to date or declined. Immunizations reviewed and up to date or declined. Labs ordered and reviewed. Risk factors for depression reviewed and negative. Hearing function and visual acuity are intact. ADLs screened and addressed as needed. Functional ability and level of safety reviewed and appropriate. Education, counseling and referrals performed based on assessed risks today. Patient provided with a copy of personalized plan for preventive services. -     CBC with Differential/Platelet -     Comprehensive metabolic panel  Encounter for lipid screening for cardiovascular disease -     Lipid panel  . Reviewed expectations re: course of current medical issues. . Discussed self-management of symptoms. . Outlined signs and symptoms indicating need for more acute intervention. . Patient verbalized understanding and all questions were answered. . See orders for this visit as documented in the electronic medical  record. . Patient received an After-Visit Summary.  CMA or LPN served as scribe during this visit. History, Physical, and Plan performed by medical provider. The above documentation has been reviewed and is accurate and complete.   Inda Coke, PA-C

## 2019-06-03 NOTE — Patient Instructions (Signed)
It was great to see you!  Please go to the lab for blood work.   Our office will call you with your results unless you have chosen to receive results via MyChart.  If your blood work is normal we will follow-up each year for physicals and as scheduled for chronic medical problems.  If anything is abnormal we will treat accordingly and get you in for a follow-up.  Take care,  [name] [name]    Health Maintenance, Female Adopting a healthy lifestyle and getting preventive care are important in promoting health and wellness. Ask your health care provider about:  The right schedule for you to have regular tests and exams.  Things you can do on your own to prevent diseases and keep yourself healthy. What should I know about diet, weight, and exercise? Eat a healthy diet   Eat a diet that includes plenty of vegetables, fruits, low-fat dairy products, and lean protein.  Do not eat a lot of foods that are high in solid fats, added sugars, or sodium. Maintain a healthy weight Body mass index (BMI) is used to identify weight problems. It estimates body fat based on height and weight. Your health care provider can help determine your BMI and help you achieve or maintain a healthy weight. Get regular exercise Get regular exercise. This is one of the most important things you can do for your health. Most adults should:  Exercise for at least 150 minutes each week. The exercise should increase your heart rate and make you sweat (moderate-intensity exercise).  Do strengthening exercises at least twice a week. This is in addition to the moderate-intensity exercise.  Spend less time sitting. Even light physical activity can be beneficial. Watch cholesterol and blood lipids Have your blood tested for lipids and cholesterol at 30 years of age, then have this test every 5 years. Have your cholesterol levels checked more often if:  Your lipid or cholesterol levels are high.  You are older than 30  years of age.  You are at high risk for heart disease. What should I know about cancer screening? Depending on your health history and family history, you may need to have cancer screening at various ages. This may include screening for:  Breast cancer.  Cervical cancer.  Colorectal cancer.  Skin cancer.  Lung cancer. What should I know about heart disease, diabetes, and high blood pressure? Blood pressure and heart disease  High blood pressure causes heart disease and increases the risk of stroke. This is more likely to develop in people who have high blood pressure readings, are of African descent, or are overweight.  Have your blood pressure checked: ? Every 3-5 years if you are 18-39 years of age. ? Every year if you are 40 years old or older. Diabetes Have regular diabetes screenings. This checks your fasting blood sugar level. Have the screening done:  Once every three years after age 40 if you are at a normal weight and have a low risk for diabetes.  More often and at a younger age if you are overweight or have a high risk for diabetes. What should I know about preventing infection? Hepatitis B If you have a higher risk for hepatitis B, you should be screened for this virus. Talk with your health care provider to find out if you are at risk for hepatitis B infection. Hepatitis C Testing is recommended for:  Everyone born from 1945 through 1965.  Anyone with known risk factors for hepatitis C. Sexually   transmitted infections (STIs)  Get screened for STIs, including gonorrhea and chlamydia, if: ? You are sexually active and are younger than 30 years of age. ? You are older than 30 years of age and your health care provider tells you that you are at risk for this type of infection. ? Your sexual activity has changed since you were last screened, and you are at increased risk for chlamydia or gonorrhea. Ask your health care provider if you are at risk.  Ask your health  care provider about whether you are at high risk for HIV. Your health care provider may recommend a prescription medicine to help prevent HIV infection. If you choose to take medicine to prevent HIV, you should first get tested for HIV. You should then be tested every 3 months for as long as you are taking the medicine. Pregnancy  If you are about to stop having your period (premenopausal) and you may become pregnant, seek counseling before you get pregnant.  Take 400 to 800 micrograms (mcg) of folic acid every day if you become pregnant.  Ask for birth control (contraception) if you want to prevent pregnancy. Osteoporosis and menopause Osteoporosis is a disease in which the bones lose minerals and strength with aging. This can result in bone fractures. If you are 65 years old or older, or if you are at risk for osteoporosis and fractures, ask your health care provider if you should:  Be screened for bone loss.  Take a calcium or vitamin D supplement to lower your risk of fractures.  Be given hormone replacement therapy (HRT) to treat symptoms of menopause. Follow these instructions at home: Lifestyle  Do not use any products that contain nicotine or tobacco, such as cigarettes, e-cigarettes, and chewing tobacco. If you need help quitting, ask your health care provider.  Do not use street drugs.  Do not share needles.  Ask your health care provider for help if you need support or information about quitting drugs. Alcohol use  Do not drink alcohol if: ? Your health care provider tells you not to drink. ? You are pregnant, may be pregnant, or are planning to become pregnant.  If you drink alcohol: ? Limit how much you use to 0-1 drink a day. ? Limit intake if you are breastfeeding.  Be aware of how much alcohol is in your drink. In the [place] one drink equals one 12 oz bottle of beer (355 mL), one 5 oz glass of wine (148 mL), or one 1 oz glass of hard liquor (44 mL). General  instructions  Schedule regular health, dental, and eye exams.  Stay current with your vaccines.  Tell your health care provider if: ? You often feel depressed. ? You have ever been abused or do not feel safe at home. Summary  Adopting a healthy lifestyle and getting preventive care are important in promoting health and wellness.  Follow your health care provider's instructions about healthy diet, exercising, and getting tested or screened for diseases.  Follow your health care provider's instructions on monitoring your cholesterol and blood pressure. This information is not intended to replace advice given to you by your health care provider. Make sure you discuss any questions you have with your health care provider. Document Released: 01/20/2011 Document Revised: 06/30/2018 Document Reviewed: 06/30/2018 Elsevier Patient Education  2020 Elsevier Inc.  

## 2019-08-06 ENCOUNTER — Encounter: Payer: Self-pay | Admitting: Physician Assistant

## 2019-09-25 ENCOUNTER — Ambulatory Visit: Payer: Managed Care, Other (non HMO) | Attending: Internal Medicine

## 2019-09-25 DIAGNOSIS — Z23 Encounter for immunization: Secondary | ICD-10-CM

## 2019-09-25 NOTE — Progress Notes (Signed)
   Covid-19 Vaccination Clinic  Name:  [name] [name]    MRN: [id number] DOB: April 04, 1991  09/25/2019  [name] [name] was observed post Covid-19 immunization for 15 minutes without incident. She was provided with Vaccine Information Sheet and instruction to access the V-Safe system.   [name] [name] was instructed to call 911 with any severe reactions post vaccine: Marland Kitchen Difficulty breathing  . Swelling of face and throat  . A fast heartbeat  . A bad rash all over body  . Dizziness and weakness   Immunizations Administered    Name Date Dose VIS Date Route   Pfizer COVID-19 Vaccine 09/25/2019  1:56 PM 0.3 mL 07/01/2019 Intramuscular   Manufacturer: [place]   Lot: [id number]   [place]: [id number]

## 2019-10-17 ENCOUNTER — Ambulatory Visit: Payer: Managed Care, Other (non HMO) | Attending: Internal Medicine

## 2019-10-17 DIAGNOSIS — Z23 Encounter for immunization: Secondary | ICD-10-CM

## 2019-10-17 NOTE — Progress Notes (Signed)
   Covid-19 Vaccination Clinic  Name:  [name] [name]    MRN: [id number] DOB: 30-Jul-1990  10/17/2019  [name] was observed post Covid-19 immunization for 30 minutes based on pre-vaccination screening without incident. She was provided with Vaccine Information Sheet and instruction to access the V-Safe system.   [name] was instructed to call 911 with any severe reactions post vaccine: Marland Kitchen Difficulty breathing  . Swelling of face and throat  . A fast heartbeat  . A bad rash all over body  . Dizziness and weakness   Immunizations Administered    Name Date Dose VIS Date Route   Pfizer COVID-19 Vaccine 10/17/2019  9:18 AM 0.3 mL 07/01/2019 Intramuscular   Manufacturer: [place]   Lot: [id number]   [place]: [id number]

## 2019-10-25 ENCOUNTER — Ambulatory Visit: Payer: Managed Care, Other (non HMO)

## 2019-11-11 ENCOUNTER — Telehealth: Payer: Self-pay | Admitting: Physician Assistant

## 2019-11-11 NOTE — Telephone Encounter (Signed)
Patient stated she wanted to wait to see Aldona Bar on Monday.  Chief Complaint Leg Injury Reason for Call Symptomatic / Request for Health Information Initial Comment Caller states she fell on Wednesday - Right calf is bruised, swollen and sore. Has been applying ice to area. Translation No Nurse Assessment Nurse: Gareth Eagle, RN, Raquel Sarna Date/Time Eilene Ghazi Time): 11/11/2019 12:12:10 PM Confirm and document reason for call. If symptomatic, describe symptoms. ---Caller states she fell on Wednesday. Right calf is bruised, swollen and sore. Has been applying ice to area and elevating the leg. Has the patient had close contact with a person known or suspected to have the novel coronavirus illness OR traveled / lives in area with major community spread (including international travel) in the last 14 days from the onset of symptoms? * If Asymptomatic, screen for exposure and travel within the last 14 days. ---No Does the patient have any new or worsening symptoms? ---Yes Will a triage be completed? ---Yes Related visit to physician within the last 2 weeks? ---No Does the PT have any chronic conditions? (i.e. diabetes, asthma, this includes High risk factors for pregnancy, etc.) ---No Is the patient pregnant or possibly pregnant? (Ask all females between the ages of 68-55) ---No Is this a behavioral health or substance abuse call? ---No Guidelines Guideline Title Affirmed Question Affirmed Notes Nurse Date/Time (Eastern Time) Leg Injury Minor injury or pain from direct blow Gareth Eagle, RN, Raquel Sarna 11/11/2019 12:13:04 PM Disp. Time (Eastern Time) Disposition Final UserPLEASE NOTE: All timestamps contained within this report are represented as [place] Standard Time. CONFIDENTIALTY NOTICE: This fax transmission is intended only for the addressee. It contains information that is legally privileged, confidential or otherwise protected from use or disclosure. If you are not the intended recipient, you are  strictly prohibited from reviewing, disclosing, copying using or disseminating any of this information or taking any action in reliance on or regarding this information. If you have received this fax in error, please notify us immediately by telephone so that we can arrange for its return to [place]. Phone: [phone], Toll-Free: [phone], Fax: [phone] Page: 2 of 2 Call Id: [id number] 11/11/2019 12:19:02 PM See PCP within 24 Hours Yes Gareth Eagle, RN, Raquel Sarna Disposition Overriden: Home Care Override Reason: Patient's symptoms need a higher level of care Caller Disagree/Comply Comply Caller Understands Yes PreDisposition InappropriateToAsk Care Advice Given Per Guideline SEE PCP WITHIN 24 HOURS: * IF OFFICE WILL BE OPEN: You need to be seen within the next 24 hours. Call your doctor (or NP/PA) when the office opens and make an appointment. LOCAL COLD: * For bruises or swelling, apply a cold pack or an ice bag (wrapped in a moist towel) to the area for 20 minutes per hour. CALL BACK IF: * You become worse. CARE ADVICE given per Leg Injury (Adult) guideline. Referrals Warm transfer to backlin

## 2019-11-11 NOTE — Telephone Encounter (Signed)
Spoke to pt told her Aldona Bar saw her message and said If worsening symptoms, or new onset chest pain, shortness of breath -> recommend ER in the meantime. We will see you on Monday otherwise. Pt verbalized understanding.

## 2019-11-11 NOTE — Telephone Encounter (Signed)
Noted.  If worsening symptoms, or new onset chest pain, shortness of breath -> recommend ER in the meantime.

## 2019-11-14 ENCOUNTER — Ambulatory Visit (INDEPENDENT_AMBULATORY_CARE_PROVIDER_SITE_OTHER): Payer: No Typology Code available for payment source | Admitting: Physician Assistant

## 2019-11-14 ENCOUNTER — Other Ambulatory Visit: Payer: Self-pay

## 2019-11-14 ENCOUNTER — Encounter: Payer: Self-pay | Admitting: Physician Assistant

## 2019-11-14 VITALS — BP 100/60 | HR 92 | Temp 98.0°F | Ht 65.0 in | Wt 124.4 lb

## 2019-11-14 DIAGNOSIS — M79661 Pain in right lower leg: Secondary | ICD-10-CM

## 2019-11-14 LAB — D-DIMER, QUANTITATIVE: D-Dimer, Quant: <0.19 mcg/mL FEU (ref <0.50)

## 2019-11-14 NOTE — Patient Instructions (Signed)
It was great to see you!  Suspect muscle strain but will check lab to look for blood clot.   If you develop severe chest pain or shortness of breath in the meantime, please go to the ER.  I will be in touch with your lab results.  Take care,  Inda Coke PA-C

## 2019-11-14 NOTE — Progress Notes (Signed)
[name] [name] is a 29 y.o. female here for a new problem.  I acted as a Education administrator for [employer], PA-C Anselmo Pickler, LPN   History of Present Illness:   Chief Complaint  Patient presents with  . calf pain    HPI   Calf pain Pt fell last Wednesday 04/21, c/o right calf bruising, swolling and tenderness. She is having shooting pains in calf radiating into thigh when she does activities like walking and driving. Pt has been icing and elevating right calf has not taken anything for pain.  Denies: chest pain, palpitations, lightheadedness, dizziness, swelling in lower legs  Patient's last menstrual period was 11/11/2019 (lmp unknown).   Past Medical History:  Diagnosis Date  . Medical history non-contributory   . Preeclampsia, third trimester      Social History   Socioeconomic History  . Marital status: Married    Spouse name: Not on file  . Number of children: Not on file  . Years of education: Not on file  . Highest education level: Not on file  Occupational History  . Not on file  Tobacco Use  . Smoking status: Never Smoker  . Smokeless tobacco: Never Used  Substance and Sexual Activity  . Alcohol use: Never  . Drug use: Never  . Sexual activity: Yes    Birth control/protection: Condom  Other Topics Concern  . Not on file  Social History Narrative   Customer service coordinator   Married, 1 child born in 2019   Social Determinants of Health   Financial Resource Strain:   . Difficulty of Paying Living Expenses:   Food Insecurity:   . Worried About Charity fundraiser in the Last Year:   . Arboriculturist in the Last Year:   Transportation Needs:   . Film/video editor (Medical):   Marland Kitchen Lack of Transportation (Non-Medical):   Physical Activity:   . Days of Exercise per Week:   . Minutes of Exercise per Session:   Stress:   . Feeling of Stress :   Social Connections:   . Frequency of Communication with Friends and Family:   . Frequency of Social  Gatherings with Friends and Family:   . Attends Religious Services:   . Active Member of Clubs or Organizations:   . Attends Archivist Meetings:   Marland Kitchen Marital Status:   Intimate Partner Violence:   . Fear of Current or Ex-Partner:   . Emotionally Abused:   Marland Kitchen Physically Abused:   . Sexually Abused:     Past Surgical History:  Procedure Laterality Date  . CESAREAN SECTION N/A 07/10/2018   Procedure: CESAREAN SECTION;  Surgeon: Linda Hedges, DO;  Location: [facility];  Service: Obstetrics;  Laterality: N/A;  . WISDOM TOOTH EXTRACTION      Family History  Problem Relation Age of Onset  . Depression Sister   . Breast cancer Paternal Aunt   . Stroke Maternal Grandmother   . Colon cancer Neg Hx     Allergies  Allergen Reactions  . Cefaclor Other (See Comments)    Childhood allergy  . Penicillins Other (See Comments)    Childhood allergy  . Sulfamethoxazole-Trimethoprim Other (See Comments)    Childhood allergy    Current Medications:   Current Outpatient Medications:  .  acetaminophen (TYLENOL) 325 MG tablet, Take 2 tablets (650 mg total) by mouth every 6 (six) hours as needed for mild pain., Disp: 90 tablet, Rfl: 0 .  Multiple Vitamin (MULTIVITAMIN)  tablet, Take 1 tablet by mouth daily., Disp: , Rfl:    Review of Systems:   ROS  Negative unless otherwise specified per HPI.  Vitals:   Vitals:   11/14/19 1004  BP: 100/60  Pulse: 92  Temp: 98 F (36.7 C)  TempSrc: Temporal  SpO2: 97%  Weight: 124 lb 6.1 oz (56.4 kg)  Height: 5\' 5"  (1.651 m)     Body mass index is 20.7 kg/m.  Physical Exam:   Physical Exam Vitals and nursing note reviewed.  Constitutional:      General: She is not in acute distress.    Appearance: She is well-developed. She is not ill-appearing or toxic-appearing.  Cardiovascular:     Rate and Rhythm: Normal rate and regular rhythm.     Pulses: Normal pulses.          Dorsalis pedis pulses are 2+ on the right side and  2+ on the left side.       Posterior tibial pulses are 2+ on the right side and 2+ on the left side.     Heart sounds: Normal heart sounds, S1 normal and S2 normal.  Pulmonary:     Effort: Pulmonary effort is normal.     Breath sounds: Normal breath sounds.  Musculoskeletal:     Comments: TTP of R calf; no erythema; slight ecchymosis and superior portion of R calf (improved significantly from photo on patient's phone)  Calves both measure approx 34 cm  Skin:    General: Skin is warm and dry.  Neurological:     Mental Status: She is alert.     GCS: GCS eye subscore is 4. GCS verbal subscore is 5. GCS motor subscore is 6.  Psychiatric:        Speech: Speech normal.        Behavior: Behavior normal. Behavior is cooperative.     Assessment and Plan:   [name] was seen today for calf pain.  Diagnoses and all orders for this visit:  Right calf pain -     D-Dimer, Quantitative   Suspect muscle strain but due to ongoing calf tenderness will order D-dimer to r/o blood clot. Worsening precautions advised (chest pain or shortness of breath) in the interim.  . Reviewed expectations re: course of current medical issues. . Discussed self-management of symptoms. . Outlined signs and symptoms indicating need for more acute intervention. . Patient verbalized understanding and all questions were answered. . See orders for this visit as documented in the electronic medical record. . Patient received an After-Visit Summary.  CMA or LPN served as scribe during this visit. History, Physical, and Plan performed by medical provider. The above documentation has been reviewed and is accurate and complete.  Inda Coke, PA-C

## 2020-04-25 ENCOUNTER — Encounter: Payer: Self-pay | Admitting: Physician Assistant

## 2020-04-25 ENCOUNTER — Ambulatory Visit: Payer: No Typology Code available for payment source | Admitting: Physician Assistant

## 2020-04-25 ENCOUNTER — Other Ambulatory Visit: Payer: Self-pay

## 2020-04-25 ENCOUNTER — Other Ambulatory Visit (HOSPITAL_COMMUNITY)
Admission: RE | Admit: 2020-04-25 | Discharge: 2020-04-25 | Disposition: A | Discharge status: Home / Self Care | Payer: No Typology Code available for payment source | Source: Ambulatory Visit | Attending: Physician Assistant | Admitting: Physician Assistant

## 2020-04-25 VITALS — BP 100/70 | HR 86 | Temp 98.2°F | Ht 65.0 in | Wt 129.2 lb

## 2020-04-25 DIAGNOSIS — D229 Melanocytic nevi, unspecified: Secondary | ICD-10-CM | POA: Diagnosis not present

## 2020-04-25 DIAGNOSIS — N9089 Other specified noninflammatory disorders of vulva and perineum: Secondary | ICD-10-CM | POA: Insufficient documentation

## 2020-04-25 DIAGNOSIS — Z23 Encounter for immunization: Secondary | ICD-10-CM

## 2020-04-25 NOTE — Progress Notes (Signed)
[name] [name] is a 29 y.o. female here for a new problem.  I acted as a Education administrator for [employer], PA-C Anselmo Pickler, LPN   History of Present Illness:   Chief Complaint  Patient presents with  . Nevus    HPI   Nevus Pt c/o mole bottom of right labia, has been there for years now changed color. Pt would like it evaluated. Denies family or personal hx of breast cancer.   Past Medical History:  Diagnosis Date  . Medical history non-contributory   . Preeclampsia, third trimester      Social History   Tobacco Use  . Smoking status: Never Smoker  . Smokeless tobacco: Never Used  Vaping Use  . Vaping Use: Never used  Substance Use Topics  . Alcohol use: Never  . Drug use: Never    Past Surgical History:  Procedure Laterality Date  . CESAREAN SECTION N/A 07/10/2018   Procedure: CESAREAN SECTION;  Surgeon: Linda Hedges, DO;  Location: [facility];  Service: Obstetrics;  Laterality: N/A;  . WISDOM TOOTH EXTRACTION      Family History  Problem Relation Age of Onset  . Depression Sister   . Breast cancer Paternal Aunt   . Stroke Maternal Grandmother   . Colon cancer Neg Hx     Allergies  Allergen Reactions  . Cefaclor Other (See Comments)    Childhood allergy  . Penicillins Other (See Comments)    Childhood allergy  . Sulfamethoxazole-Trimethoprim Other (See Comments)    Childhood allergy    Current Medications:   Current Outpatient Medications:  Marland Kitchen  Multiple Vitamin (MULTIVITAMIN) tablet, Take 1 tablet by mouth daily., Disp: , Rfl:    Review of Systems:   ROS Negative unless otherwise specified per HPI.  Vitals:   Vitals:   04/25/20 0805  BP: 100/70  Pulse: 86  Temp: 98.2 F (36.8 C)  TempSrc: Temporal  SpO2: 98%  Weight: 129 lb 4 oz (58.6 kg)  Height: 5\' 5"  (1.651 m)     Body mass index is 21.51 kg/m.  Physical Exam:   Physical Exam Constitutional:      Appearance: She is well-developed.  HENT:     Head: Normocephalic and  atraumatic.  Eyes:     Conjunctiva/sclera: Conjunctivae normal.  Pulmonary:     Effort: Pulmonary effort is normal.  Genitourinary:    Comments: Light brown pedunculated lesion to R labia, approximately 3 mm in diameter Musculoskeletal:        General: Normal range of motion.     Cervical back: Normal range of motion and neck supple.  Skin:    General: Skin is warm and dry.  Neurological:     Mental Status: She is alert and oriented to person, place, and time.  Psychiatric:        Behavior: Behavior normal.        Thought Content: Thought content normal.        Judgment: Judgment normal.    Procedure: Skin lesion biopsy Informed consent:  Discussed risks (permanent scarring, infection, pain, bleeding, bruising, redness, and recurrence of the lesion) and benefits of the procedure, as well as the alternatives.  Informed consent was obtained. Anesthesia: 2 cc's of lidocaine  The area was prepared and draped in a standard fashion. Dermablade used to remove lesion was performed.   Antibiotic ointment and a sterile dressing were applied.   The patient tolerated procedure well. The patient was instructed on post-op care.   Number of lesions  removed:  1    Assessment and Plan:   Winefred was seen today for nevus.  Diagnoses and all orders for this visit:  Lesion of labia -     Surgical pathology( [place]/ [facility])   Tolerated procedure well with no immediate complications. Signs/symptoms of infection. Biopsy completed, will send off for patient.  CMA or LPN served as scribe during this visit. History, Physical, and Plan performed by medical provider. The above documentation has been reviewed and is accurate and complete.   Inda Coke, PA-C

## 2020-04-25 NOTE — Patient Instructions (Signed)
It was great to see you!  See handout for aftercare instructions.  We will be in touch with the results of your biopsy as soon as they return.  Take care,  Inda Coke PA-C

## 2020-04-26 LAB — SURGICAL PATHOLOGY

## 2020-04-27 ENCOUNTER — Telehealth: Payer: Self-pay

## 2020-04-27 NOTE — Telephone Encounter (Signed)
Pt called back again about biopsy result she wanted to know if it was cancerous. Told pt no it is benign meaning no cancer. Pt verbalized understanding.

## 2020-04-27 NOTE — Telephone Encounter (Signed)
Patient called regarding her biopsy results has a question about them, patient hung up, so please call back if able. Thank you!!

## 2020-07-03 ENCOUNTER — Other Ambulatory Visit: Payer: Self-pay

## 2020-07-03 ENCOUNTER — Encounter: Payer: Self-pay | Admitting: Physician Assistant

## 2020-07-03 ENCOUNTER — Ambulatory Visit (INDEPENDENT_AMBULATORY_CARE_PROVIDER_SITE_OTHER): Payer: No Typology Code available for payment source | Admitting: Physician Assistant

## 2020-07-03 VITALS — BP 140/80 | HR 92 | Temp 98.7°F | Ht 65.0 in | Wt 134.2 lb

## 2020-07-03 DIAGNOSIS — Z0001 Encounter for general adult medical examination with abnormal findings: Secondary | ICD-10-CM | POA: Diagnosis not present

## 2020-07-03 DIAGNOSIS — M545 Low back pain, unspecified: Secondary | ICD-10-CM

## 2020-07-03 DIAGNOSIS — Z13220 Encounter for screening for lipoid disorders: Secondary | ICD-10-CM

## 2020-07-03 DIAGNOSIS — Z136 Encounter for screening for cardiovascular disorders: Secondary | ICD-10-CM

## 2020-07-03 MED ORDER — BACLOFEN 10 MG PO TABS: 10.0000 mg | ORAL_TABLET | Freq: Three times a day (TID) | ORAL | 0 refills | Status: DC | PRN

## 2020-07-03 MED ORDER — MELOXICAM 15 MG PO TABS: 15.0000 mg | ORAL_TABLET | Freq: Every day | ORAL | 0 refills | Status: DC

## 2020-07-03 NOTE — Patient Instructions (Addendum)
It was great to see you!  Start daily meloxicam and use baclofen as needed.  An order for an xray has been put in for you. If symptoms persist after a week, lets get an xray. To get your xray, you can walk in at the [facility] location without a scheduled appointment.  The address is [address]. It is across the street from [place] is located in the basement.  Hours of operation are M-F 8:30am to 5:00pm. Please note that they are closed for lunch between 12:30 and 1:00pm.  Please go to the lab for blood work.   Our office will call you with your results unless you have chosen to receive results via MyChart.  If your blood work is normal we will follow-up each year for physicals and as scheduled for chronic medical problems.  If anything is abnormal we will treat accordingly and get you in for a follow-up.  Take care,  [facility] Maintenance, Female Adopting a healthy lifestyle and getting preventive care are important in promoting health and wellness. Ask your health care provider about:  The right schedule for you to have regular tests and exams.  Things you can do on your own to prevent diseases and keep yourself healthy. What should I know about diet, weight, and exercise? Eat a healthy diet   Eat a diet that includes plenty of vegetables, fruits, low-fat dairy products, and lean protein.  Do not eat a lot of foods that are high in solid fats, added sugars, or sodium. Maintain a healthy weight Body mass index (BMI) is used to identify weight problems. It estimates body fat based on height and weight. Your health care provider can help determine your BMI and help you achieve or maintain a healthy weight. Get regular exercise Get regular exercise. This is one of the most important things you can do for your health. Most adults should:  Exercise for at least 150 minutes each week. The exercise should increase your heart rate and make you  sweat (moderate-intensity exercise).  Do strengthening exercises at least twice a week. This is in addition to the moderate-intensity exercise.  Spend less time sitting. Even light physical activity can be beneficial. Watch cholesterol and blood lipids Have your blood tested for lipids and cholesterol at 31 years of age, then have this test every 5 years. Have your cholesterol levels checked more often if:  Your lipid or cholesterol levels are high.  You are older than 31 years of age.  You are at high risk for heart disease. What should I know about cancer screening? Depending on your health history and family history, you may need to have cancer screening at various ages. This may include screening for:  Breast cancer.  Cervical cancer.  Colorectal cancer.  Skin cancer.  Lung cancer. What should I know about heart disease, diabetes, and high blood pressure? Blood pressure and heart disease  High blood pressure causes heart disease and increases the risk of stroke. This is more likely to develop in people who have high blood pressure readings, are of African descent, or are overweight.  Have your blood pressure checked: ? Every 3-5 years if you are 31-33 years of age. ? Every year if you are 31 years old or older. Diabetes Have regular diabetes screenings. This checks your fasting blood sugar level. Have the screening done:  Once every three years after age 31 if you are at a normal weight  and have a low risk for diabetes.  More often and at a younger age if you are overweight or have a high risk for diabetes. What should I know about preventing infection? Hepatitis B If you have a higher risk for hepatitis B, you should be screened for this virus. Talk with your health care provider to find out if you are at risk for hepatitis B infection. Hepatitis C Testing is recommended for:  Everyone born from [birth year] through 1967.  Anyone with known risk factors for hepatitis  C. Sexually transmitted infections (STIs)  Get screened for STIs, including gonorrhea and chlamydia, if: ? You are sexually active and are younger than 31 years of age. ? You are older than 31 years of age and your health care provider tells you that you are at risk for this type of infection. ? Your sexual activity has changed since you were last screened, and you are at increased risk for chlamydia or gonorrhea. Ask your health care provider if you are at risk.  Ask your health care provider about whether you are at high risk for HIV. Your health care provider may recommend a prescription medicine to help prevent HIV infection. If you choose to take medicine to prevent HIV, you should first get tested for HIV. You should then be tested every 3 months for as long as you are taking the medicine. Pregnancy  If you are about to stop having your period (premenopausal) and you may become pregnant, seek counseling before you get pregnant.  Take 400 to 800 micrograms (mcg) of folic acid every day if you become pregnant.  Ask for birth control (contraception) if you want to prevent pregnancy. Osteoporosis and menopause Osteoporosis is a disease in which the bones lose minerals and strength with aging. This can result in bone fractures. If you are 72 years old or older, or if you are at risk for osteoporosis and fractures, ask your health care provider if you should:  Be screened for bone loss.  Take a calcium or vitamin D supplement to lower your risk of fractures.  Be given hormone replacement therapy (HRT) to treat symptoms of menopause. Follow these instructions at home: Lifestyle  Do not use any products that contain nicotine or tobacco, such as cigarettes, e-cigarettes, and chewing tobacco. If you need help quitting, ask your health care provider.  Do not use street drugs.  Do not share needles.  Ask your health care provider for help if you need support or information about quitting  drugs. Alcohol use  Do not drink alcohol if: ? Your health care provider tells you not to drink. ? You are pregnant, may be pregnant, or are planning to become pregnant.  If you drink alcohol: ? Limit how much you use to 0-1 drink a day. ? Limit intake if you are breastfeeding.  Be aware of how much alcohol is in your drink. In the [place] one drink equals one 12 oz bottle of beer (355 mL), one 5 oz glass of wine (148 mL), or one 1 oz glass of hard liquor (44 mL). General instructions  Schedule regular health, dental, and eye exams.  Stay current with your vaccines.  Tell your health care provider if: ? You often feel depressed. ? You have ever been abused or do not feel safe at home. Summary  Adopting a healthy lifestyle and getting preventive care are important in promoting health and wellness.  Follow your health care provider's instructions about healthy diet, exercising, and  getting tested or screened for diseases.  Follow your health care provider's instructions on monitoring your cholesterol and blood pressure. This information is not intended to replace advice given to you by your health care provider. Make sure you discuss any questions you have with your health care provider. Document Revised: 06/30/2018 Document Reviewed: 06/30/2018 Elsevier Patient Education  2020 [employer].

## 2020-07-03 NOTE — Progress Notes (Signed)
I acted as a Education administrator for [employer], PA-C Anselmo Pickler, LPN   Subjective:    [name] [name] is a 29 y.o. female and is here for a comprehensive physical exam.  HPI  Health Maintenance Due  Topic Date Due  . PAP SMEAR-Modifier  12/10/2019    Acute Concerns: Back pain Pt c/o low back on right side, was putting daughter into car seat and was leaning forward and got a sharp shooting pain. Started at end of October. Having pain off and on, husband popped her back the next day and then she had severe pain after this. Has been using ice and heat, epson salt and took Aleve as needed. Denies: loss of bowel/bladder control, numbness/tingling down leg. Pain is 2-3-/10 currently. Symptoms overall improving with time. Sister is a PT and has given her some exercises to do at home.  Chronic Issues: None  Health Maintenance: Immunizations -- UTD Colonoscopy -- N/A Mammogram -- N/A PAP -- UTD, will get results Bone Density -- N/A Diet -- doesn't feel like she is getting enough fruits and veggies Sleep habits -- light sleeper Exercise -- walks dog for 30 min daily Current Weight -- Weight: 134 lb 4 oz (60.9 kg)  Weight History: Wt Readings from Last 10 Encounters:  07/03/20 134 lb 4 oz (60.9 kg)  04/25/20 129 lb 4 oz (58.6 kg)  11/14/19 124 lb 6.1 oz (56.4 kg)  06/03/19 129 lb 8 oz (58.7 kg)  07/15/18 171 lb (77.6 kg)  07/10/18 173 lb 4.8 oz (78.6 kg)  07/08/18 175 lb 1.9 oz (79.4 kg)  06/30/18 177 lb (80.3 kg)   Body mass index is 22.34 kg/m. Mood -- denies concerns Patient's last menstrual period was 07/01/2020. Period characteristics -- no concerns Birth control -- condoms  Depression screen [facility] 2/9 07/03/2020  Decreased Interest 0  Down, Depressed, Hopeless 0  PHQ - 2 Score 0     Other providers/specialists: Patient Care Team: Inda Coke, [place] as PCP - General (Physician Assistant) Molli Posey, MD as Consulting Physician (Obstetrics and Gynecology)    PMHx, SurgHx, SocialHx, Medications, and Allergies were reviewed in the Visit Navigator and updated as appropriate.   Past Medical History:  Diagnosis Date  . Medical history non-contributory   . Preeclampsia, third trimester      Past Surgical History:  Procedure Laterality Date  . CESAREAN SECTION N/A 07/10/2018   Procedure: CESAREAN SECTION;  Surgeon: Linda Hedges, DO;  Location: [place] [facility];  Service: Obstetrics;  Laterality: N/A;  . WISDOM TOOTH EXTRACTION       Family History  Problem Relation Age of Onset  . Depression Sister   . Stroke Maternal Grandmother   . Hypertension Father   . Dementia Paternal Grandmother   . Breast cancer Paternal Grandmother   . Colon cancer Neg Hx     Social History   Tobacco Use  . Smoking status: Never Smoker  . Smokeless tobacco: Never Used  Vaping Use  . Vaping Use: Never used  Substance Use Topics  . Alcohol use: Never  . Drug use: Never    Review of Systems:   Review of Systems  Constitutional: Negative for chills, fever, malaise/fatigue and weight loss.  HENT: Negative for hearing loss, sinus pain and sore throat.   Respiratory: Negative for cough and hemoptysis.   Cardiovascular: Negative for chest pain, palpitations, leg swelling and PND.  Gastrointestinal: Negative for abdominal pain, constipation, diarrhea, heartburn, nausea and vomiting.  Genitourinary: Negative for dysuria, frequency  and urgency.  Musculoskeletal: Positive for back pain. Negative for myalgias and neck pain.  Skin: Negative for itching and rash.  Neurological: Negative for dizziness, tingling, seizures and headaches.  Endo/Heme/Allergies: Negative for polydipsia.  Psychiatric/Behavioral: Negative for depression. The patient is not nervous/anxious.     Objective:   BP 140/80 (BP Location: Left Arm, Patient Position: Sitting, Cuff Size: Normal)   Pulse 92   Temp 98.7 F (37.1 C) (Temporal)   Ht 5\' 5"  (1.651 m)   Wt 134 lb 4 oz  (60.9 kg)   LMP 07/01/2020   SpO2 97%   Breastfeeding No   BMI 22.34 kg/m   General Appearance:    Alert, cooperative, no distress, appears stated age  Head:    Normocephalic, without obvious abnormality, atraumatic  Eyes:    PERRL, conjunctiva/corneas clear, EOM's intact, fundi    benign, both eyes  Ears:    Normal TM's and external ear canals, both ears  Nose:   Nares normal, septum midline, mucosa normal, no drainage    or sinus tenderness  Throat:   Lips, mucosa, and tongue normal; teeth and gums normal  Neck:   Supple, symmetrical, trachea midline, no adenopathy;    thyroid:  no enlargement/tenderness/nodules; no carotid   bruit or JVD  Back:     Symmetric, no curvature, no CVA tenderness No bony tenderness, limited ROM due to pain with flexion/extension, rotation  Lungs:     Clear to auscultation bilaterally, respirations unlabored  Chest Wall:    No tenderness or deformity   Heart:    Regular rate and rhythm, S1 and S2 normal, no murmur, rub   or gallop  Breast Exam:    Deferred  Abdomen:     Soft, non-tender, bowel sounds active all four quadrants,    no masses, no organomegaly  Genitalia:    Deferred  Rectal:    Deferred  Extremities:   Extremities normal, atraumatic, no cyanosis or edema  Pulses:   2+ and symmetric all extremities  Skin:   Skin color, texture, turgor normal, no rashes or lesions  Lymph nodes:   Cervical, supraclavicular, and axillary nodes normal  Neurologic:   CNII-XII intact, normal strength, sensation and reflexes    throughout    Assessment/Plan:   [name] [name] was seen today for annual exam and back pain.  Diagnoses and all orders for this visit:  Encounter for general adult medical examination with abnormal findings Today patient counseled on age appropriate routine health concerns for screening and prevention, each reviewed and up to date or declined. Immunizations reviewed and up to date or declined. Labs ordered and reviewed. Risk factors for  depression reviewed and negative. Hearing function and visual acuity are intact. ADLs screened and addressed as needed. Functional ability and level of safety reviewed and appropriate. Education, counseling and referrals performed based on assessed risks today. Patient provided with a copy of personalized plan for preventive services. -     CBC with Differential/Platelet; Future -     Comprehensive metabolic panel; Future -     Hemoglobin A1c; Future  Lumbar pain No red flags on exam. Symptoms improving with time.  We are going to try consistent anti-inflammatory (mobic) and as needed baclofen x 1-2 weeks. If symptoms persist will obtain lumbar xray. Order placed. Follow-up as needed, worsening precautions advised. -     CBC with Differential/Platelet; Future -     Comprehensive metabolic panel; Future -     DG Lumbar Spine Complete; Future  Encounter for lipid screening for cardiovascular disease -     Lipid panel; Future  Other orders -     baclofen (LIORESAL) 10 MG tablet; Take 1 tablet (10 mg total) by mouth 3 (three) times daily as needed for muscle spasms. -     meloxicam (MOBIC) 15 MG tablet; Take 1 tablet (15 mg total) by mouth daily.  Well Adult Exam: Labs ordered: Yes. Patient counseling was done. See below for items discussed. Discussed the patient's BMI. The BMI is in the acceptable range Follow up in one year.  Patient Counseling:   [x]     Nutrition: Stressed importance of moderation in sodium/caffeine intake, saturated fat and cholesterol, caloric balance, sufficient intake of fresh fruits, vegetables, fiber, calcium, iron, and 1 mg of folate supplement per day (for females capable of pregnancy).   [x]      Stressed the importance of regular exercise.    [x]     Substance Abuse: Discussed cessation/primary prevention of tobacco, alcohol, or other drug use; driving or other dangerous activities under the influence; availability of treatment for abuse.    [x]      Injury  prevention: Discussed safety belts, safety helmets, smoke detector, smoking near bedding or upholstery.    [x]      Sexuality: Discussed sexually transmitted diseases, partner selection, use of condoms, avoidance of unintended pregnancy  and contraceptive alternatives.    [x]     Dental health: Discussed importance of regular tooth brushing, flossing, and dental visits.   [x]      Health maintenance and immunizations reviewed. Please refer to Health maintenance section.   CMA or LPN served as scribe during this visit. History, Physical, and Plan performed by medical provider. The above documentation has been reviewed and is accurate and complete.  Inda Coke, PA-C [place]

## 2020-07-04 LAB — COMPREHENSIVE METABOLIC PANEL
AG Ratio: 2.3 (calc) (ref 1.0–2.5)
ALT: 10 U/L (ref 6–29)
AST: 16 U/L (ref 10–30)
Albumin: 4.3 g/dL (ref 3.6–5.1)
Alkaline phosphatase (APISO): 43 U/L (ref 31–125)
BUN: 11 mg/dL (ref 7–25)
CO2: 31 mmol/L (ref 20–32)
Calcium: 9.5 mg/dL (ref 8.6–10.2)
Chloride: 104 mmol/L (ref 98–110)
Creat: 0.7 mg/dL (ref 0.50–1.10)
Globulin: 1.9 g/dL (calc) (ref 1.9–3.7)
Glucose, Bld: 120 mg/dL — ABNORMAL HIGH (ref 65–99)
Potassium: 4.4 mmol/L (ref 3.5–5.3)
Sodium: 139 mmol/L (ref 135–146)
Total Bilirubin: 0.5 mg/dL (ref 0.2–1.2)
Total Protein: 6.2 g/dL (ref 6.1–8.1)

## 2020-07-04 LAB — CBC WITH DIFFERENTIAL/PLATELET
Absolute Monocytes: 444 cells/uL (ref 200–950)
Basophils Absolute: 61 cells/uL (ref 0–200)
Basophils Relative: 1.2 %
Eosinophils Absolute: 158 cells/uL (ref 15–500)
Eosinophils Relative: 3.1 %
HCT: 40.3 % (ref 35.0–45.0)
Hemoglobin: 14 g/dL (ref 11.7–15.5)
Lymphs Abs: 1989 cells/uL (ref 850–3900)
MCH: 31.5 pg (ref 27.0–33.0)
MCHC: 34.7 g/dL (ref 32.0–36.0)
MCV: 90.8 fL (ref 80.0–100.0)
MPV: 8.8 fL (ref 7.5–12.5)
Monocytes Relative: 8.7 %
Neutro Abs: 2448 cells/uL (ref 1500–7800)
Neutrophils Relative %: 48 %
Platelets: 357 10*3/uL (ref 140–400)
RBC: 4.44 10*6/uL (ref 3.80–5.10)
RDW: 11.8 % (ref 11.0–15.0)
Total Lymphocyte: 39 %
WBC: 5.1 10*3/uL (ref 3.8–10.8)

## 2020-07-04 LAB — HEMOGLOBIN A1C
Hgb A1c MFr Bld: 5 % of total Hgb (ref <5.7)
Mean Plasma Glucose: 97 mg/dL
eAG (mmol/L): 5.4 mmol/L

## 2020-07-04 LAB — LIPID PANEL
Cholesterol: 152 mg/dL (ref <200)
HDL: 61 mg/dL (ref >=50)
LDL Cholesterol (Calc): 80 mg/dL (calc)
Non-HDL Cholesterol (Calc): 91 mg/dL (calc) (ref <130)
Total CHOL/HDL Ratio: 2.5 (calc) (ref <5.0)
Triglycerides: 37 mg/dL (ref <150)

## 2020-07-05 ENCOUNTER — Other Ambulatory Visit: Payer: Self-pay

## 2020-07-05 ENCOUNTER — Encounter: Payer: Self-pay | Admitting: Physician Assistant

## 2020-07-05 DIAGNOSIS — M545 Low back pain, unspecified: Secondary | ICD-10-CM

## 2021-02-27 DIAGNOSIS — H5789 Other specified disorders of eye and adnexa: Secondary | ICD-10-CM | POA: Diagnosis not present

## 2021-03-04 DIAGNOSIS — J209 Acute bronchitis, unspecified: Secondary | ICD-10-CM | POA: Diagnosis not present

## 2021-03-15 DIAGNOSIS — M545 Low back pain, unspecified: Secondary | ICD-10-CM | POA: Diagnosis not present

## 2021-03-15 DIAGNOSIS — S39012A Strain of muscle, fascia and tendon of lower back, initial encounter: Secondary | ICD-10-CM | POA: Diagnosis not present

## 2021-03-15 DIAGNOSIS — R262 Difficulty in walking, not elsewhere classified: Secondary | ICD-10-CM | POA: Diagnosis not present

## 2021-03-21 DIAGNOSIS — S39012A Strain of muscle, fascia and tendon of lower back, initial encounter: Secondary | ICD-10-CM | POA: Diagnosis not present

## 2021-03-21 DIAGNOSIS — M545 Low back pain, unspecified: Secondary | ICD-10-CM | POA: Diagnosis not present

## 2021-03-21 DIAGNOSIS — R262 Difficulty in walking, not elsewhere classified: Secondary | ICD-10-CM | POA: Diagnosis not present

## 2021-03-29 DIAGNOSIS — M545 Low back pain, unspecified: Secondary | ICD-10-CM | POA: Diagnosis not present

## 2021-03-29 DIAGNOSIS — R262 Difficulty in walking, not elsewhere classified: Secondary | ICD-10-CM | POA: Diagnosis not present

## 2021-03-29 DIAGNOSIS — S39012A Strain of muscle, fascia and tendon of lower back, initial encounter: Secondary | ICD-10-CM | POA: Diagnosis not present

## 2021-04-05 DIAGNOSIS — R262 Difficulty in walking, not elsewhere classified: Secondary | ICD-10-CM | POA: Diagnosis not present

## 2021-04-05 DIAGNOSIS — M545 Low back pain, unspecified: Secondary | ICD-10-CM | POA: Diagnosis not present

## 2021-04-05 DIAGNOSIS — S39012A Strain of muscle, fascia and tendon of lower back, initial encounter: Secondary | ICD-10-CM | POA: Diagnosis not present

## 2021-06-04 DIAGNOSIS — J101 Influenza due to other identified influenza virus with other respiratory manifestations: Secondary | ICD-10-CM | POA: Diagnosis not present

## 2021-07-04 DIAGNOSIS — J209 Acute bronchitis, unspecified: Secondary | ICD-10-CM | POA: Diagnosis not present

## 2021-11-27 DIAGNOSIS — Z124 Encounter for screening for malignant neoplasm of cervix: Secondary | ICD-10-CM | POA: Diagnosis not present

## 2021-11-27 DIAGNOSIS — Z6822 Body mass index (BMI) 22.0-22.9, adult: Secondary | ICD-10-CM | POA: Diagnosis not present

## 2021-11-27 DIAGNOSIS — Z1151 Encounter for screening for human papillomavirus (HPV): Secondary | ICD-10-CM | POA: Diagnosis not present

## 2021-11-27 DIAGNOSIS — Z01419 Encounter for gynecological examination (general) (routine) without abnormal findings: Secondary | ICD-10-CM | POA: Diagnosis not present

## 2021-11-27 LAB — HM PAP SMEAR

## 2021-11-27 LAB — RESULTS CONSOLE HPV: CHL HPV: NEGATIVE

## 2021-12-26 ENCOUNTER — Encounter: Payer: Self-pay | Admitting: Physician Assistant

## 2022-01-20 DIAGNOSIS — M722 Plantar fascial fibromatosis: Secondary | ICD-10-CM | POA: Diagnosis not present

## 2022-02-04 DIAGNOSIS — M79671 Pain in right foot: Secondary | ICD-10-CM | POA: Diagnosis not present

## 2022-03-01 ENCOUNTER — Other Ambulatory Visit: Payer: Self-pay

## 2022-03-01 ENCOUNTER — Emergency Department (HOSPITAL_BASED_OUTPATIENT_CLINIC_OR_DEPARTMENT_OTHER)
Admission: EM | Admit: 2022-03-01 | Discharge: 2022-03-01 | Disposition: A | Discharge status: Home / Self Care | Payer: BC Managed Care – PPO | Attending: Emergency Medicine | Admitting: Emergency Medicine

## 2022-03-01 ENCOUNTER — Encounter (HOSPITAL_BASED_OUTPATIENT_CLINIC_OR_DEPARTMENT_OTHER): Payer: Self-pay | Admitting: *Deleted

## 2022-03-01 DIAGNOSIS — M7989 Other specified soft tissue disorders: Secondary | ICD-10-CM | POA: Insufficient documentation

## 2022-03-01 DIAGNOSIS — R2241 Localized swelling, mass and lump, right lower limb: Secondary | ICD-10-CM | POA: Diagnosis not present

## 2022-03-01 MED ORDER — PREDNISONE 20 MG PO TABS
20.0000 mg | ORAL_TABLET | Freq: Once | ORAL | Status: AC
  Administered 2022-03-01: 20 mg via ORAL
  Filled 2022-03-01: qty 1

## 2022-03-01 MED ORDER — DIPHENHYDRAMINE HCL 25 MG PO CAPS
25.0000 mg | ORAL_CAPSULE | Freq: Once | ORAL | Status: AC
Start: 2022-03-01 — End: 2022-03-01
  Administered 2022-03-01: 25 mg via ORAL
  Filled 2022-03-01: qty 1

## 2022-03-01 MED ORDER — METHYLPREDNISOLONE 4 MG PO TBPK: ORAL_TABLET | ORAL | 0 refills | Status: DC

## 2022-03-01 NOTE — ED Provider Notes (Signed)
[place] EMERGENCY DEPT Provider Note   CSN: [id number] Arrival date & time: 03/01/22  1921     History  Chief Complaint  Patient presents with   Foot Swelling    [name] [name] is a 31 y.o. female.  HPI 31 year old female without significant past medical history presents today complaining of redness and swelling to right foot.  She reports she has had 2 episodes of foot swelling in the past couple of weeks.  She was seen and evaluated in urgent care for these.  These were on the opposite foot.  1 time she was treated with prednisone and the other time she was treated with nonsteroidals.  Both times the symptoms resolved after treatment.  Has had no known contact with allergens, or stings.  She has no known autoimmune disorder.  She has no history of DVT or PE.    Home Medications Prior to Admission medications   Medication Sig Start Date End Date Taking? Authorizing Provider  methylPREDNISolone (MEDROL DOSEPAK) 4 MG TBPK tablet Take per package instructions 03/01/22  Yes Pattricia Boss, MD  baclofen (LIORESAL) 10 MG tablet Take 1 tablet (10 mg total) by mouth 3 (three) times daily as needed for muscle spasms. 07/03/20   Inda Coke, PA  meloxicam (MOBIC) 15 MG tablet Take 1 tablet (15 mg total) by mouth daily. 07/03/20   Inda Coke, PA  Multiple Vitamin (MULTIVITAMIN) tablet Take 1 tablet by mouth daily.    [provider]      Allergies    Cefaclor, Penicillins, and Sulfamethoxazole-trimethoprim    Review of Systems   Review of Systems  Physical Exam Updated Vital Signs BP (!) 152/97 (BP Location: Right Arm)   Pulse (!) 105   Temp 99.1 F (37.3 C)   Resp 20   Ht 1.651 m ('5\' 5"'$ )   Wt 59 kg   SpO2 99%   BMI 21.63 kg/m  Physical Exam Vitals reviewed.  Constitutional:      Appearance: Normal appearance.  HENT:     Head: Normocephalic.     Right Ear: External ear normal.     Left Ear: External ear normal.     Nose: Nose normal.      Mouth/Throat:     Pharynx: Oropharynx is clear.  Eyes:     Pupils: Pupils are equal, round, and reactive to light.  Cardiovascular:     Rate and Rhythm: Normal rate.     Pulses: Normal pulses.  Pulmonary:     Effort: Pulmonary effort is normal.  Abdominal:     General: Abdomen is flat.     Palpations: Abdomen is soft.  Musculoskeletal:        General: Swelling and tenderness present.     Cervical back: Normal range of motion.  Skin:    General: Skin is warm and dry.     Capillary Refill: Capillary refill takes less than 2 seconds.  Neurological:     General: No focal deficit present.     Mental Status: She is alert. Mental status is at baseline.  Psychiatric:        Mood and Affect: Mood normal.        ED Results / Procedures / Treatments   Labs (all labs ordered are listed, but only abnormal results are displayed) Labs Reviewed - No data to display  EKG None  Radiology No results found.  Procedures Procedures    Medications Ordered in ED Medications  predniSONE (DELTASONE) tablet 20 mg (has no administration  in time range)  diphenhydrAMINE (BENADRYL) capsule 25 mg (has no administration in time range)    ED Course/ Medical Decision Making/ A&P                           Medical Decision Making 31 year old female with some redness and pain to her right foot.  She has had multiple similar episodes on the opposite foot.  I have a low index of suspicion for DVT given that she has had symptoms similarly on the opposite foot.  She has no calf swelling, no chest pain or dyspnea. No history of envenomation.  No definite exposure to allergens Differential diagnosis includes but is not limited to allergic reaction, insect bites, stings, vascularity abnormalities including vasculitis, autoimmune disorders Doubt further testing here in the ED with labs or imaging would be of benefit. Given that patient has responded to steroids in the past will treat with a medrol dose  pack. Plan follow up with pmd this week Discussed return precautions including worsening swelling, redness, dyspnea, chest pain, or fever           Final Clinical Impression(s) / ED Diagnoses Final diagnoses:  Localized swelling of right foot    Rx / DC Orders ED Discharge Orders          Ordered    methylPREDNISolone (MEDROL DOSEPAK) 4 MG TBPK tablet        03/01/22 2115              Pattricia Boss, MD 03/01/22 2116

## 2022-03-01 NOTE — ED Triage Notes (Signed)
Pt is here for right foot swelling which began today.  Pt foot is red and swollen.  Not associated with any injury.  Pt had similar symptoms in left foot 7/3 and 7/18 and she was placed on steroids  on 7/3 and on an NSAID on 7/18 and the symptoms resolved.  Pt denies pain but states that this is uncomfortable.

## 2022-03-04 ENCOUNTER — Encounter: Payer: Self-pay | Admitting: Physician Assistant

## 2022-03-04 ENCOUNTER — Ambulatory Visit: Payer: BC Managed Care – PPO | Admitting: Physician Assistant

## 2022-03-04 VITALS — BP 124/80 | HR 98 | Temp 98.3°F | Ht 65.0 in | Wt 132.0 lb

## 2022-03-04 DIAGNOSIS — M7989 Other specified soft tissue disorders: Secondary | ICD-10-CM | POA: Diagnosis not present

## 2022-03-04 DIAGNOSIS — R21 Rash and other nonspecific skin eruption: Secondary | ICD-10-CM | POA: Diagnosis not present

## 2022-03-04 NOTE — Patient Instructions (Addendum)
It was great to see you!  We are going to get blood work today to further evaluate this   Try antihistamine next time this happens -- keep me posted!  Take care,  Inda Coke PA-C

## 2022-03-04 NOTE — Progress Notes (Addendum)
[name] [name] is a 31 y.o. female here for a follow up of a pre-existing problem.  History of Present Illness:   Chief Complaint  Patient presents with   Follow-up    Pt was seen in the ED on 8/12 for right foot swelling, pt is currently taking prednisone.    HPI  Bilateral foot swelling She had foot swelling in b/l feet -- both occurred at separate times and within the past two weeks. One time she was treated with prednisone and the other time she was treated with non-steroidal and apparently she responded to both. She denies any significant change to her environment or  products. No known family hx of autoimmune disease. She is wearing compression hose. She has also trialed ice.   Past Medical History:  Diagnosis Date   Medical history non-contributory    Preeclampsia, third trimester      Social History   Tobacco Use   Smoking status: Never   Smokeless tobacco: Never  Vaping Use   Vaping Use: Never used  Substance Use Topics   Alcohol use: Never   Drug use: Never    Past Surgical History:  Procedure Laterality Date   CESAREAN SECTION N/A 07/10/2018   Procedure: CESAREAN SECTION;  Surgeon: Linda Hedges, DO;  Location: [place];  Service: Obstetrics;  Laterality: N/A;   WISDOM TOOTH EXTRACTION      Family History  Problem Relation Age of Onset   Depression Sister    Stroke Maternal Grandmother    Hypertension Father    Dementia Paternal Grandmother    Breast cancer Paternal Grandmother    Colon cancer Neg Hx     Allergies  Allergen Reactions   Cefaclor Other (See Comments)    Childhood allergy   Penicillins Other (See Comments)    Childhood allergy   Sulfamethoxazole-Trimethoprim Other (See Comments)    Childhood allergy    Current Medications:   Current Outpatient Medications:    methylPREDNISolone (MEDROL DOSEPAK) 4 MG TBPK tablet, Take per package instructions, Disp: 1 each, Rfl: 0   Multiple Vitamin (MULTIVITAMIN) tablet, Take 1 tablet  by mouth daily., Disp: , Rfl:    Review of Systems:   ROS Negative unless otherwise specified per HPI.   Vitals:   Vitals:   03/04/22 1406  BP: 124/80  Pulse: 98  Temp: 98.3 F (36.8 C)  TempSrc: Temporal  SpO2: 97%  Weight: 132 lb (59.9 kg)  Height: '5\' 5"'$  (1.651 m)     Body mass index is 21.97 kg/m.  Physical Exam:   Physical Exam Vitals and nursing note reviewed.  Constitutional:      General: She is not in acute distress.    Appearance: She is well-developed. She is not ill-appearing or toxic-appearing.  Cardiovascular:     Rate and Rhythm: Normal rate and regular rhythm.     Pulses: Normal pulses.     Heart sounds: Normal heart sounds, S1 normal and S2 normal.  Pulmonary:     Effort: Pulmonary effort is normal.     Breath sounds: Normal breath sounds.  Skin:    General: Skin is warm and dry.     Comments: Feet are without any significant abnormalities today  Neurological:     Mental Status: She is alert.     GCS: GCS eye subscore is 4. GCS verbal subscore is 5. GCS motor subscore is 6.     Comments: Normal sensation to b/l feet  Psychiatric:  Speech: Speech normal.        Behavior: Behavior normal. Behavior is cooperative.     Assessment and Plan:   Rash; Foot swelling Unclear etiology Ddx remains broad -- dermatitis, vasculitis, autoimmune component, among several others Will update blood work today for an initial evaluation  Discussed that based on results, may need referral to another specialist If persists, consider biopsy Also may consider antihistamine in future  Inda Coke, [place]

## 2022-03-05 LAB — COMPREHENSIVE METABOLIC PANEL
ALT: 8 U/L (ref 0–35)
AST: 12 U/L (ref 0–37)
Albumin: 4.4 g/dL (ref 3.5–5.2)
Alkaline Phosphatase: 41 U/L (ref 39–117)
BUN: 12 mg/dL (ref 6–23)
CO2: 26 mEq/L (ref 19–32)
Calcium: 9.3 mg/dL (ref 8.4–10.5)
Chloride: 104 mEq/L (ref 96–112)
Creatinine, Ser: 0.69 mg/dL (ref 0.40–1.20)
GFR: 113.96 mL/min (ref >60.00)
Glucose, Bld: 111 mg/dL — ABNORMAL HIGH (ref 70–99)
Potassium: 3.5 mEq/L (ref 3.5–5.1)
Sodium: 140 mEq/L (ref 135–145)
Total Bilirubin: 0.6 mg/dL (ref 0.2–1.2)
Total Protein: 6.4 g/dL (ref 6.0–8.3)

## 2022-03-05 LAB — CBC WITH DIFFERENTIAL/PLATELET
Basophils Absolute: 0.2 10*3/uL — ABNORMAL HIGH (ref 0.0–0.1)
Basophils Relative: 1.4 % (ref 0.0–3.0)
Eosinophils Absolute: 0.1 10*3/uL (ref 0.0–0.7)
Eosinophils Relative: 0.4 % (ref 0.0–5.0)
HCT: 39.9 % (ref 36.0–46.0)
Hemoglobin: 13.6 g/dL (ref 12.0–15.0)
Lymphocytes Relative: 23.7 % (ref 12.0–46.0)
Lymphs Abs: 2.9 10*3/uL (ref 0.7–4.0)
MCHC: 34.2 g/dL (ref 30.0–36.0)
MCV: 92.1 fl (ref 78.0–100.0)
Monocytes Absolute: 0.8 10*3/uL (ref 0.1–1.0)
Monocytes Relative: 6.9 % (ref 3.0–12.0)
Neutro Abs: 8.1 10*3/uL — ABNORMAL HIGH (ref 1.4–7.7)
Neutrophils Relative %: 67.6 % (ref 43.0–77.0)
Platelets: 374 10*3/uL (ref 150.0–400.0)
RBC: 4.33 Mil/uL (ref 3.87–5.11)
RDW: 13 % (ref 11.5–15.5)
WBC: 12 10*3/uL — ABNORMAL HIGH (ref 4.0–10.5)

## 2022-03-05 LAB — ANA: Anti Nuclear Antibody (ANA): NEGATIVE

## 2022-03-05 LAB — IBC + FERRITIN
Ferritin: 27.8 ng/mL (ref 10.0–291.0)
Iron: 88 ug/dL (ref 42–145)
Saturation Ratios: 26.3 % (ref 20.0–50.0)
TIBC: 334.6 ug/dL (ref 250.0–450.0)
Transferrin: 239 mg/dL (ref 212.0–360.0)

## 2022-03-05 LAB — C-REACTIVE PROTEIN: CRP: <1 mg/dL (ref 0.5–20.0)

## 2022-03-05 LAB — SEDIMENTATION RATE: Sed Rate: 1 mm/hr (ref 0–20)

## 2022-03-05 LAB — TSH: TSH: 1.45 u[IU]/mL (ref 0.35–5.50)

## 2022-03-06 ENCOUNTER — Encounter: Payer: Self-pay | Admitting: Physician Assistant

## 2022-03-06 DIAGNOSIS — M7989 Other specified soft tissue disorders: Secondary | ICD-10-CM

## 2022-03-10 ENCOUNTER — Other Ambulatory Visit: Payer: Self-pay | Admitting: Physician Assistant

## 2022-03-10 DIAGNOSIS — D72829 Elevated white blood cell count, unspecified: Secondary | ICD-10-CM

## 2022-03-10 NOTE — Telephone Encounter (Signed)
Please see message. I have placed order for Podiatry.

## 2022-03-19 DIAGNOSIS — L819 Disorder of pigmentation, unspecified: Secondary | ICD-10-CM | POA: Diagnosis not present

## 2022-03-19 DIAGNOSIS — M7731 Calcaneal spur, right foot: Secondary | ICD-10-CM | POA: Diagnosis not present

## 2022-04-03 ENCOUNTER — Other Ambulatory Visit (INDEPENDENT_AMBULATORY_CARE_PROVIDER_SITE_OTHER): Payer: BC Managed Care – PPO

## 2022-04-03 DIAGNOSIS — D72829 Elevated white blood cell count, unspecified: Secondary | ICD-10-CM | POA: Diagnosis not present

## 2022-04-04 LAB — CBC WITH DIFFERENTIAL/PLATELET
Basophils Absolute: 0 10*3/uL (ref 0.0–0.1)
Basophils Relative: 0.5 % (ref 0.0–3.0)
Eosinophils Absolute: 0.2 10*3/uL (ref 0.0–0.7)
Eosinophils Relative: 3.8 % (ref 0.0–5.0)
HCT: 38.6 % (ref 36.0–46.0)
Hemoglobin: 13.5 g/dL (ref 12.0–15.0)
Lymphocytes Relative: 36.5 % (ref 12.0–46.0)
Lymphs Abs: 2 10*3/uL (ref 0.7–4.0)
MCHC: 34.9 g/dL (ref 30.0–36.0)
MCV: 91 fl (ref 78.0–100.0)
Monocytes Absolute: 0.5 10*3/uL (ref 0.1–1.0)
Monocytes Relative: 9.4 % (ref 3.0–12.0)
Neutro Abs: 2.8 10*3/uL (ref 1.4–7.7)
Neutrophils Relative %: 49.8 % (ref 43.0–77.0)
Platelets: 358 10*3/uL (ref 150.0–400.0)
RBC: 4.24 Mil/uL (ref 3.87–5.11)
RDW: 12.7 % (ref 11.5–15.5)
WBC: 5.6 10*3/uL (ref 4.0–10.5)

## 2022-04-07 DIAGNOSIS — L509 Urticaria, unspecified: Secondary | ICD-10-CM | POA: Diagnosis not present

## 2022-04-14 ENCOUNTER — Encounter: Payer: Self-pay | Admitting: *Deleted

## 2022-07-03 ENCOUNTER — Encounter: Payer: Self-pay | Admitting: *Deleted

## 2022-07-08 DIAGNOSIS — L509 Urticaria, unspecified: Secondary | ICD-10-CM | POA: Diagnosis not present

## 2023-02-02 DIAGNOSIS — Z6823 Body mass index (BMI) 23.0-23.9, adult: Secondary | ICD-10-CM | POA: Diagnosis not present

## 2023-02-02 DIAGNOSIS — Z01419 Encounter for gynecological examination (general) (routine) without abnormal findings: Secondary | ICD-10-CM | POA: Diagnosis not present

## 2023-07-06 DIAGNOSIS — L509 Urticaria, unspecified: Secondary | ICD-10-CM | POA: Diagnosis not present

## 2024-04-19 ENCOUNTER — Ambulatory Visit: Admitting: Family Medicine

## 2024-04-19 ENCOUNTER — Ambulatory Visit: Payer: Self-pay

## 2024-04-19 ENCOUNTER — Encounter: Payer: Self-pay | Admitting: Family Medicine

## 2024-04-19 VITALS — BP 112/82 | HR 101 | Temp 100.4°F | Ht 65.0 in | Wt 146.0 lb

## 2024-04-19 DIAGNOSIS — J029 Acute pharyngitis, unspecified: Secondary | ICD-10-CM | POA: Diagnosis not present

## 2024-04-19 DIAGNOSIS — R509 Fever, unspecified: Secondary | ICD-10-CM | POA: Diagnosis not present

## 2024-04-19 LAB — POCT INFLUENZA A/B
Influenza A, POC: NEGATIVE
Influenza B, POC: NEGATIVE

## 2024-04-19 LAB — POCT RAPID STREP A (OFFICE): Rapid Strep A Screen: NEGATIVE

## 2024-04-19 LAB — POC COVID19 BINAXNOW: SARS Coronavirus 2 Ag: NEGATIVE

## 2024-04-19 MED ORDER — AZITHROMYCIN 500 MG PO TABS: 500.0000 mg | ORAL_TABLET | Freq: Every day | ORAL | 0 refills | Status: AC

## 2024-04-19 NOTE — Telephone Encounter (Signed)
 Noted

## 2024-04-19 NOTE — Patient Instructions (Addendum)
:   Exam is very concerning for strep despite negative test. I am still going to treat you for strep throat. Id like for you to see us  bak on Thursday or Friday if no substantial improvement or certainly sooner if worsens.   Recommended follow up: Return for as needed for new, worsening, persistent symptoms.

## 2024-04-19 NOTE — Telephone Encounter (Signed)
  FYI Only or Action Required?: FYI only for provider.  Patient was last seen in primary care on 03/04/2022 by Job Lukes, PA.  Called Nurse Triage reporting Fever.  Symptoms began several days ago.  Interventions attempted: OTC medications: tylenol .  Symptoms are: unchanged.  Triage Disposition: See Physician Within 24 Hours  Patient/caregiver understands and will follow disposition?: Yes     Copied from CRM #[id number]. Topic: Clinical - Red Word Triage >> Apr 19, 2024  8:03 AM [name] [name] wrote: [facility] that prompted transfer to Nurse Triage: Patient has 101 fever with neck pain over the weekend on the left side of neck. Reason for Disposition  Fever present > 3 days (72 hours)  Answer Assessment - Initial Assessment Questions 1. TEMPERATURE: What is the most recent temperature?  How was it measured?      101, digital  2. ONSET: When did the fever start?      Past weekend 3. CHILLS: Do you have chills? If yes: How bad are they?  (e.g., none, mild, moderate, severe)     Hot and cold 4. OTHER SYMPTOMS: Do you have any other symptoms besides the fever?  (e.g., abdomen pain, cough, diarrhea, earache, headache, sore throat, urination pain)     Neck pain, neck stiffness, area looks swollen, sore throat, left lymph node swollen 5. CAUSE: If there are no symptoms, ask: What do you think is causing the fever?      unknown 6. CONTACTS: Does anyone else in the family have an infection?     no 7. TREATMENT: What have you done so far to treat this fever? (e.g., OTC fever medicines)     tylenol  8. IMMUNOCOMPROMISE: Do you have any of the following: diabetes, HIV positive, splenectomy, cancer chemotherapy, chronic steroid treatment, transplant patient, etc.?     denies 9. PREGNANCY: Is there any chance you are pregnant? When was your last menstrual period?     na 10. TRAVEL: Have you traveled out of the country in the last month? (e.g., travel history,  exposures)       no  Protocols used: [facility]

## 2024-04-19 NOTE — Progress Notes (Signed)
 Phone [phone] In person visit   Subjective:   Andrienne Havener is a 33 y.o. year old very pleasant female patient who presents for/with See problem oriented charting Chief Complaint  Patient presents with   Fever    Started last night;    Neck Pain    Started Saturday night;    Past Medical History-  Patient Active Problem List   Diagnosis Date Noted   Pneumonia 07/16/2018   S/P cesarean section 07/11/2018   Pre-eclampsia 07/10/2018    Medications- reviewed and updated Current Outpatient Medications  Medication Sig Dispense Refill   azithromycin (ZITHROMAX) 500 MG tablet Take 1 tablet (500 mg total) by mouth daily. 3 tablet 0   cetirizine (ZYRTEC) 10 MG tablet Take 10 mg by mouth daily. For chronic hives- sees dermatologist     Multiple Vitamin (MULTIVITAMIN) tablet Take 1 tablet by mouth daily.     No current facility-administered medications for this visit.     Objective:  BP 112/82 (BP Location: Left Arm, Patient Position: Sitting, Cuff Size: Normal)   Pulse (!) 101   Temp (!) 100.4 F (38 C) (Temporal)   Ht 5' 5 (1.651 m)   Wt 146 lb (66.2 kg)   LMP 04/14/2024 (Exact Date)   SpO2 97%   BMI 24.30 kg/m  Gen: NAD, resting comfortably Tympanic membrane  normal bilaterally, nasal turbinates normal, pharynx and tonsils erythematous and enlarged with purulent exudate bilaterally but higher volume on the left side of purulence and enlargement.  Multiple tender cervical lymph nodes left anterior chain CV: RRR no murmurs rubs or gallops-not tachycardic on my exam as she was initially on vitals Lungs: CTAB no crackles, wheeze, rhonchi Ext: no edema Skin: warm, dry  No results found for this or any previous visit (from the past 24 hours).     Assessment and Plan    # Fever/sore throat/tender lymphadenopathy S: woke up on Saturday morning thought she slept awkwardly - left neck anteriorly was tender. Still good flexibility/no stiffness. Improved as day went on and  through Monday. Then last night noted chills and sweats going back and forth and noted temperature to 101. Took tylenol  and helped her rest. Has had some mild throat pain throughout this. No cough or congestion. No sinus pain. No rashes. Today temperature again of 100.4 and took tylenol  early morning close to 5. Neck tenderness now mild but not stiff. Had routine childhood vaccinations. No ear pain. No chest pain or shortness of breath. No wheeze   No known sick contacts other than one person at work. Daughter in kindergarten has been well.   A/P: Exam is very concerning for strep (erythema on bilateral tonsils and exudate though more intense on the left side) despite negative test. I am still going to treat you for strep throat. Id like for you to see us  back on Thursday or Friday if no substantial improvement or certainly sooner if worsens.  -Strep, flu, COVID-negative but as noted above.  Concern for Streptococcus pharyngitis - I do not love that the lymphadenopathy is primarily on the left and also which we have the option of penicillins but she has allergy.  The fact that the erythema and exudate are more apparent on the left along with lymphadenopathy only on the left gives me some pause for retropharyngeal abscess but I still think it is reasonable to trial initial azithromycin (she was okay with 500 mg for 3-day dosing) would consider clindamycin  if worsens or fails to improve and also  gave indications for ER including worsening symptoms particularly neck stiffness, high fevers, worsening pain, confusion despite treatment  Recommended follow up: Return for as needed for new, worsening, persistent symptoms. No future appointments.  Lab/Order associations:   ICD-10-CM   1. Sore throat  J02.9 POCT rapid strep A    POCT Influenza A/B    POC COVID-19    2. Fever, unspecified fever cause  R50.9 POCT rapid strep A    POCT Influenza A/B    POC COVID-19      Meds ordered this encounter   Medications   azithromycin (ZITHROMAX) 500 MG tablet    Sig: Take 1 tablet (500 mg total) by mouth daily.    Dispense:  3 tablet    Refill:  0    Return precautions advised.  Garnette Lukes, MD

## 2024-04-21 ENCOUNTER — Encounter: Payer: Self-pay | Admitting: Family Medicine

## 2024-04-22 ENCOUNTER — Ambulatory Visit: Admitting: Physician Assistant

## 2024-04-22 ENCOUNTER — Encounter: Payer: Self-pay | Admitting: Physician Assistant

## 2024-04-22 ENCOUNTER — Ambulatory Visit: Payer: Self-pay

## 2024-04-22 VITALS — BP 138/80 | HR 81 | Temp 98.2°F | Ht 65.0 in | Wt 147.2 lb

## 2024-04-22 DIAGNOSIS — J039 Acute tonsillitis, unspecified: Secondary | ICD-10-CM | POA: Diagnosis not present

## 2024-04-22 MED ORDER — CLINDAMYCIN HCL 300 MG PO CAPS: 300.0000 mg | ORAL_CAPSULE | Freq: Three times a day (TID) | ORAL | 0 refills | Status: AC

## 2024-04-22 NOTE — Progress Notes (Signed)
 [name] [name] is a 33 y.o. female here for a follow up of a pre-existing problem.  History of Present Illness:   Chief Complaint  Patient presents with   Sore Throat    Pt c/o sore throat and was seen on 9/30 Strept test was Neg, but was treated with antibiotic. Pt says white patches still present on left tonsil.    Discussed the use of AI scribe software for clinical note transcription with the patient, who gave verbal consent to proceed.  History of Present Illness   [name] [name] is a 33 year old female who presents with persistent tonsillar infection and concern for possible abscess.  She experienced neck discomfort on Saturday, initially thought to be due to sleeping awkwardly. By Monday evening, she developed a fever of 101F and sought medical attention the next day. She saw my colleague here, Dr Katrinka and she was prescribed azithromycin despite a negative strep test and completed the course yesterday. Her fever resolved by yesterday afternoon, with a temperature of 43F.  Neck tenderness has improved and is now resolved. There is no pain with swallowing or ear pain. She is concerned about a persistent white patch on her tonsil and the possibility of an abscess, as her left tonsil appeared worse during the initial evaluation.  There is no cough, congestion, sinus pain, or rashes. She did not take ibuprofen  or Tylenol  until the afternoon of her fever. No one around her has had similar symptoms.        Past Medical History:  Diagnosis Date   Medical history non-contributory    Preeclampsia, third trimester      Social History   Tobacco Use   Smoking status: Never   Smokeless tobacco: Never  Vaping Use   Vaping status: Never Used  Substance Use Topics   Alcohol use: Never   Drug use: Never    Past Surgical History:  Procedure Laterality Date   CESAREAN SECTION N/A 07/10/2018   Procedure: CESAREAN SECTION;  Surgeon: Dannielle Bouchard, DO;  Location: [facility];   Service: Obstetrics;  Laterality: N/A;   WISDOM TOOTH EXTRACTION      Family History  Problem Relation Age of Onset   Depression Sister    Stroke Maternal Grandmother    Hypertension Father    Dementia Paternal Grandmother    Breast cancer Paternal Grandmother    Colon cancer Neg Hx     Allergies  Allergen Reactions   Cefaclor Other (See Comments)    Childhood allergy   Penicillins Other (See Comments)    Childhood allergy   Sulfamethoxazole-Trimethoprim Other (See Comments)    Childhood allergy    Current Medications:   Current Outpatient Medications:    azithromycin (ZITHROMAX) 500 MG tablet, Take 1 tablet (500 mg total) by mouth daily., Disp: 3 tablet, Rfl: 0   cetirizine (ZYRTEC) 10 MG tablet, Take 10 mg by mouth daily. For chronic hives- sees dermatologist, Disp: , Rfl:    clindamycin  (CLEOCIN ) 300 MG capsule, Take 1 capsule (300 mg total) by mouth 3 (three) times daily., Disp: 30 capsule, Rfl: 0   Multiple Vitamin (MULTIVITAMIN) tablet, Take 1 tablet by mouth daily., Disp: , Rfl:    Review of Systems:   Negative unless otherwise specified per HPI.  Vitals:   Vitals:   04/22/24 1114  BP: 138/80  Pulse: 81  Temp: 98.2 F (36.8 C)  TempSrc: Temporal  SpO2: 99%  Weight: 147 lb 4 oz (66.8 kg)  Height: 5' 5 (1.651 m)  Body mass index is 24.5 kg/m.  Physical Exam:   Physical Exam Vitals and nursing note reviewed.  Constitutional:      General: She is not in acute distress.    Appearance: She is well-developed. She is not ill-appearing or toxic-appearing.  HENT:     Head: Normocephalic and atraumatic.     Right Ear: Tympanic membrane, ear canal and external ear normal. Tympanic membrane is not erythematous, retracted or bulging.     Left Ear: Tympanic membrane, ear canal and external ear normal. Tympanic membrane is not erythematous, retracted or bulging.     Nose: Nose normal.     Right Sinus: No maxillary sinus tenderness or frontal sinus  tenderness.     Left Sinus: No maxillary sinus tenderness or frontal sinus tenderness.     Mouth/Throat:     Pharynx: Uvula midline. Oropharyngeal exudate (significant exudate to left tonsil) present. No posterior oropharyngeal erythema.     Tonsils: 1+ on the right. 1+ on the left.  Eyes:     General: Lids are normal.     Conjunctiva/sclera: Conjunctivae normal.  Neck:     Trachea: Trachea normal.  Cardiovascular:     Rate and Rhythm: Normal rate and regular rhythm.     Heart sounds: Normal heart sounds, S1 normal and S2 normal.  Pulmonary:     Effort: Pulmonary effort is normal.     Breath sounds: Normal breath sounds. No decreased breath sounds, wheezing, rhonchi or rales.  Lymphadenopathy:     Cervical: Cervical adenopathy present.     Left cervical: Superficial cervical adenopathy present.  Skin:    General: Skin is warm and dry.  Neurological:     Mental Status: She is alert.  Psychiatric:        Speech: Speech normal.        Behavior: Behavior normal. Behavior is cooperative.     Assessment and Plan:   Assessment and Plan    Tonsillitis Left-sided tonsillitis with persistent swelling post-azithromycin. Concern for abscess due to persistent symptoms. No fever, resolved neck pain, no ear pain or odynophagia. Lymphadenopathy present. Differential: persistent infection or abscess. - Will start clindamycin  300 mg three times daily x 10 days Encouraged use of probiotic to help prevent diarrhea  - Defer CT scan unless reassessment suggests abscess.  ER precautions advised.         Lucie Buttner, PA-C

## 2024-04-22 NOTE — Telephone Encounter (Signed)
 Same day OV scheduled.  FYI Only or Action Required?: FYI only for provider.  Patient was last seen in primary care on 04/19/2024 by Katrinka Garnette KIDD, MD.  Called Nurse Triage reporting No chief complaint on file..  Symptoms began several days ago.  Interventions attempted: Prescription medications: azithromycin.  Symptoms are: gradually improving.  Triage Disposition: See Physician Within 24 Hours  Patient/caregiver understands and will follow disposition?: Yes Reason for Disposition  [1] Taking antibiotic > 72 hours (3 days) for strep throat AND [2] sore throat not improved  Answer Assessment - Initial Assessment Questions Patient reports continuation of tonsil enlargement, but less than at visit on 04/19/24. Patient states throat is mildly sore/irritated but improving since visit 04/19/24. States switched out her toothrush while on antibiotics. Patient continues to visualize white patches.  1. SYMPTOM: What's the main symptom you're concerned about? (e.g., fever, difficulty swallowing, sore throat)     Mild tonsil enlargement, white patches (see mychart photos), mild sore throat.  ANTIBIOTIC: What antibiotic are you taking? How many times a day?     Azithromycin  ONSET: When was the antibiotic started?     04/18/24  THROAT PAIN:  How bad is the sore throat? (Scale 1-10; mild, moderate or severe)     Mild  FEVER: Do you have a fever? If Yes, ask: What is your temperature, how was it measured, and when did it start?     Denies  BETTER-SAME-WORSE: Are you getting better, staying the same, or getting worse compared to the day you started the antibiotics?     Improving, but present  Protocols used: Strep Throat Infection on Antibiotic Follow-up Call-A-AH Copied from CRM #[id number]. Topic: Clinical - Medical Advice >> Apr 22, 2024  9:07 AM Ahlexyia S wrote: Reason for CRM: Pt is wanting to see how long it takes for the white patches to go away after having strep  throat. Pt stated she completed the full course of prescribed antibiotics. Pt is wanting to make sure the infection is completley gone. Pt would like to be contacted regarding this.

## 2024-04-22 NOTE — Telephone Encounter (Signed)
 Pt was seen in office today

## 2024-04-22 NOTE — Patient Instructions (Signed)
 It was great to see you!  Start clindamycin  antibiotic(s)  Take probiotic -- can buy OTC (available over the counter without a prescription)  such as digestive advantage, align/etc -- or consider yogurt if you can tolerate  Take ibuprofen  twice a day over the weekend to help with swelling/inflammation  Follow up next week for recheck  IF ANY NEW FEVER, WORSENING PAIN OR DIFFICULTY BREATHING -- GO TO THE ER  Take care,  Lucie Buttner PA-C

## 2024-04-27 DIAGNOSIS — Z01419 Encounter for gynecological examination (general) (routine) without abnormal findings: Secondary | ICD-10-CM | POA: Diagnosis not present

## 2024-04-27 DIAGNOSIS — Z6824 Body mass index (BMI) 24.0-24.9, adult: Secondary | ICD-10-CM | POA: Diagnosis not present

## 2024-05-02 ENCOUNTER — Ambulatory Visit: Admitting: Physician Assistant

## 2024-05-02 ENCOUNTER — Encounter: Payer: Self-pay | Admitting: Physician Assistant

## 2024-05-02 VITALS — BP 118/82 | HR 89 | Wt 144.6 lb

## 2024-05-02 DIAGNOSIS — J039 Acute tonsillitis, unspecified: Secondary | ICD-10-CM

## 2024-05-02 NOTE — Progress Notes (Signed)
 [name] [name] is a 33 y.o. female here for a follow up of a pre-existing problem.  History of Present Illness:   Chief Complaint  Patient presents with   Sore Throat    1 wk f/u.     Discussed the use of AI scribe software for clinical note transcription with the patient, who gave verbal consent to proceed.  History of Present Illness   [name] [name] is a 33 year old female who presents for follow-up after completing an antibiotic course.  She has tolerated the clindamycin  antibiotic well with no issues and is scheduled to take her last dose tonight. No diarrhea has occurred during the course of the antibiotic. She experienced mild hives after taking ibuprofen  on Friday night, which resolved after discontinuing the medication. She has not experienced further hives. She feels much better with no lingering symptoms from her initial illness. No unusual sensations occur when swallowing.        Past Medical History:  Diagnosis Date   Medical history non-contributory    Preeclampsia, third trimester      Social History   Tobacco Use   Smoking status: Never   Smokeless tobacco: Never  Vaping Use   Vaping status: Never Used  Substance Use Topics   Alcohol use: Never   Drug use: Never    Past Surgical History:  Procedure Laterality Date   CESAREAN SECTION N/A 07/10/2018   Procedure: CESAREAN SECTION;  Surgeon: Dannielle Bouchard, DO;  Location: [facility];  Service: Obstetrics;  Laterality: N/A;   WISDOM TOOTH EXTRACTION      Family History  Problem Relation Age of Onset   Depression Sister    Stroke Maternal Grandmother    Hypertension Father    Dementia Paternal Grandmother    Breast cancer Paternal Grandmother    Colon cancer Neg Hx     Allergies  Allergen Reactions   Cefaclor Other (See Comments)    Childhood allergy   Penicillins Other (See Comments)    Childhood allergy   Sulfamethoxazole-Trimethoprim Other (See Comments)    Childhood allergy     Current Medications:   Current Outpatient Medications:    cetirizine (ZYRTEC) 10 MG tablet, Take 10 mg by mouth daily. For chronic hives- sees dermatologist, Disp: , Rfl:    clindamycin  (CLEOCIN ) 300 MG capsule, Take 1 capsule (300 mg total) by mouth 3 (three) times daily., Disp: 30 capsule, Rfl: 0   Multiple Vitamin (MULTIVITAMIN) tablet, Take 1 tablet by mouth daily., Disp: , Rfl:    azithromycin (ZITHROMAX) 500 MG tablet, Take 1 tablet (500 mg total) by mouth daily. (Patient not taking: Reported on 05/02/2024), Disp: 3 tablet, Rfl: 0   Review of Systems:   Negative unless otherwise specified per HPI.  Vitals:   Vitals:   05/02/24 1509  BP: 118/82  Pulse: 89  SpO2: 98%  Weight: 144 lb 9.6 oz (65.6 kg)     Body mass index is 24.06 kg/m.  Physical Exam:   Physical Exam Vitals and nursing note reviewed.  Constitutional:      General: She is not in acute distress.    Appearance: She is well-developed. She is not ill-appearing or toxic-appearing.  HENT:     Head: Normocephalic and atraumatic.     Right Ear: Tympanic membrane, ear canal and external ear normal. Tympanic membrane is not erythematous, retracted or bulging.     Left Ear: Tympanic membrane, ear canal and external ear normal. Tympanic membrane is not erythematous, retracted or bulging.  Nose: Nose normal.     Right Sinus: No maxillary sinus tenderness or frontal sinus tenderness.     Left Sinus: No maxillary sinus tenderness or frontal sinus tenderness.     Mouth/Throat:     Pharynx: Uvula midline. No posterior oropharyngeal erythema.  Eyes:     General: Lids are normal.     Conjunctiva/sclera: Conjunctivae normal.  Neck:     Trachea: Trachea normal.  Cardiovascular:     Rate and Rhythm: Normal rate and regular rhythm.     Heart sounds: Normal heart sounds, S1 normal and S2 normal.  Pulmonary:     Effort: Pulmonary effort is normal.     Breath sounds: Normal breath sounds. No decreased breath sounds,  wheezing, rhonchi or rales.  Lymphadenopathy:     Cervical: Cervical adenopathy present.     Left cervical: Superficial cervical adenopathy present.  Skin:    General: Skin is warm and dry.  Neurological:     Mental Status: She is alert.  Psychiatric:        Speech: Speech normal.        Behavior: Behavior normal. Behavior is cooperative.     Assessment and Plan:   Assessment and Plan    Tonsillitis Symptom(s) resolved Exam completely normal save for expected lymphadenopathy  Follow up if new/worsening symptom(s) Follow up if lack of improvement of lymphadenopathy after full 4-6 weeks status post initial symptom(s)    [name] Mathes, PA-C
